# Patient Record
Sex: Male | Born: 1985
Health system: Southern US, Community
[De-identification: ages and names within clinical notes are randomized; demographics above are authoritative.]

## PROBLEM LIST (undated history)

## (undated) ENCOUNTER — Inpatient Hospital Stay: Admission: EM | Payer: Self-pay | Source: Home / Self Care

## (undated) DIAGNOSIS — E039 Hypothyroidism, unspecified: Secondary | ICD-10-CM

## (undated) DIAGNOSIS — N200 Calculus of kidney: Secondary | ICD-10-CM

## (undated) DIAGNOSIS — F209 Schizophrenia, unspecified: Secondary | ICD-10-CM

## (undated) HISTORY — PX: URETER SURGERY: SHX823

## (undated) HISTORY — PX: CYSTOSCOPY: SUR368

## (undated) HISTORY — DX: Schizophrenia, unspecified: F20.9

## (undated) HISTORY — DX: Hypothyroidism, unspecified: E03.9

---

## 2014-12-28 ENCOUNTER — Emergency Department (HOSPITAL_COMMUNITY)
Admission: EM | Admit: 2014-12-28 | Discharge: 2014-12-28 | Disposition: A | Discharge status: Home / Self Care | Payer: Medicaid Other | Attending: Emergency Medicine | Admitting: Emergency Medicine

## 2014-12-28 ENCOUNTER — Encounter (HOSPITAL_COMMUNITY): Payer: Self-pay | Admitting: Vascular Surgery

## 2014-12-28 DIAGNOSIS — R51 Headache: Secondary | ICD-10-CM | POA: Diagnosis not present

## 2014-12-28 DIAGNOSIS — H5713 Ocular pain, bilateral: Secondary | ICD-10-CM | POA: Insufficient documentation

## 2014-12-28 DIAGNOSIS — F17210 Nicotine dependence, cigarettes, uncomplicated: Secondary | ICD-10-CM | POA: Diagnosis not present

## 2014-12-28 DIAGNOSIS — H53149 Visual discomfort, unspecified: Secondary | ICD-10-CM | POA: Insufficient documentation

## 2014-12-28 DIAGNOSIS — Z87442 Personal history of urinary calculi: Secondary | ICD-10-CM | POA: Diagnosis not present

## 2014-12-28 HISTORY — DX: Calculus of kidney: N20.0

## 2014-12-28 LAB — COMPREHENSIVE METABOLIC PANEL
ALBUMIN: 4.2 g/dL (ref 3.5–5.0)
ALT: 15 U/L — AB (ref 17–63)
AST: 19 U/L (ref 15–41)
Alkaline Phosphatase: 39 U/L (ref 38–126)
Anion gap: 9 (ref 5–15)
BUN: 9 mg/dL (ref 6–20)
CHLORIDE: 103 mmol/L (ref 101–111)
CO2: 26 mmol/L (ref 22–32)
CREATININE: 0.83 mg/dL (ref 0.61–1.24)
Calcium: 9.6 mg/dL (ref 8.9–10.3)
GFR calc Af Amer: >60 mL/min (ref >60)
GFR calc non Af Amer: >60 mL/min (ref >60)
GLUCOSE: 92 mg/dL (ref 65–99)
Potassium: 4 mmol/L (ref 3.5–5.1)
SODIUM: 138 mmol/L (ref 135–145)
Total Bilirubin: 0.3 mg/dL (ref 0.3–1.2)
Total Protein: 7.6 g/dL (ref 6.5–8.1)

## 2014-12-28 LAB — CBC WITH DIFFERENTIAL/PLATELET
Basophils Absolute: 0 10*3/uL (ref 0.0–0.1)
Basophils Relative: 0 %
EOS PCT: 4 %
Eosinophils Absolute: 0.3 10*3/uL (ref 0.0–0.7)
HCT: 43.2 % (ref 39.0–52.0)
HEMOGLOBIN: 14 g/dL (ref 13.0–17.0)
LYMPHS ABS: 2.5 10*3/uL (ref 0.7–4.0)
LYMPHS PCT: 32 %
MCH: 28.3 pg (ref 26.0–34.0)
MCHC: 32.4 g/dL (ref 30.0–36.0)
MCV: 87.4 fL (ref 78.0–100.0)
MONOS PCT: 10 %
Monocytes Absolute: 0.7 10*3/uL (ref 0.1–1.0)
Neutro Abs: 4.2 10*3/uL (ref 1.7–7.7)
Neutrophils Relative %: 54 %
PLATELETS: 370 10*3/uL (ref 150–400)
RBC: 4.94 MIL/uL (ref 4.22–5.81)
RDW: 13.8 % (ref 11.5–15.5)
WBC: 7.8 10*3/uL (ref 4.0–10.5)

## 2014-12-28 MED ORDER — TETRACAINE HCL 0.5 % OP SOLN
2.0000 [drp] | Freq: Once | OPHTHALMIC | Status: AC
  Administered 2014-12-28: 2 [drp] via OPHTHALMIC
  Filled 2014-12-28: qty 2

## 2014-12-28 MED ORDER — FLUORESCEIN SODIUM 1 MG OP STRP
1.0000 | ORAL_STRIP | Freq: Once | OPHTHALMIC | Status: AC
  Administered 2014-12-28: 1 via OPHTHALMIC
  Filled 2014-12-28: qty 1

## 2014-12-28 MED ORDER — OXYCODONE-ACETAMINOPHEN 5-325 MG PO TABS
2.0000 | ORAL_TABLET | Freq: Once | ORAL | Status: AC
  Administered 2014-12-28: 2 via ORAL
  Filled 2014-12-28: qty 2

## 2014-12-28 NOTE — ED Notes (Signed)
Patient able to ambulate independently.  Pt was shown address to meet ophthalmologist across the street for emergency appointment.  Patient verbalized understanding.

## 2014-12-28 NOTE — ED Notes (Signed)
Pt reports to the ED for eval of severe HA and pain behind his right eye x 3 days. Pt denies any vision loss, blurred vision, or diplopia. Also denies any N/V. Pt reports light and sound make the pain worse. Pt A&Ox4, resp e/u, and skin warm and dry.

## 2014-12-28 NOTE — Discharge Instructions (Signed)
You have been seen today for eye pain. Your lab tests showed no abnormalities. Follow up with PCP as needed. Return to ED should symptoms worsen. Follow-up with the ophthalmologist as soon as you leave here.
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
## 2015-01-12 MED FILL — SUBOXONE 8 MG-2 MG SL FILM: 8-2 | 7 days supply | Qty: 14 | Fill #0

## 2015-01-19 MED FILL — SUBOXONE 8 MG-2 MG SL FILM: 8-2 | 7 days supply | Qty: 14 | Fill #0

## 2015-01-26 MED FILL — SUBOXONE 8 MG-2 MG SL FILM: 8-2 | 7 days supply | Qty: 14 | Fill #0

## 2015-02-02 MED FILL — SUBOXONE 8 MG-2 MG SL FILM: 8-2 | 7 days supply | Qty: 14 | Fill #0

## 2015-02-09 MED FILL — SUBOXONE 8 MG-2 MG SL FILM: 8-2 | 14 days supply | Qty: 28 | Fill #0

## 2015-02-22 MED FILL — SUBOXONE 8 MG-2 MG SL FILM: 8-2 | 14 days supply | Qty: 28 | Fill #0

## 2015-03-08 MED FILL — SUBOXONE 8 MG-2 MG SL FILM: 8-2 | 30 days supply | Qty: 60 | Fill #0

## 2015-04-05 MED FILL — SUBOXONE 8 MG-2 MG SL FILM: 8-2 | 28 days supply | Qty: 56 | Fill #0

## 2015-05-03 MED FILL — SUBOXONE 8 MG-2 MG SL FILM: 8-2 | 28 days supply | Qty: 56 | Fill #0

## 2015-05-31 MED FILL — SUBOXONE 8 MG-2 MG SL FILM: 8-2 | 28 days supply | Qty: 56 | Fill #0

## 2015-06-28 MED FILL — SUBOXONE 8 MG-2 MG SL FILM: 8-2 | 28 days supply | Qty: 56 | Fill #0

## 2015-07-26 MED FILL — SUBOXONE 8 MG-2 MG SL FILM: 8-2 | 28 days supply | Qty: 56 | Fill #0

## 2015-08-23 MED FILL — SUBOXONE 8 MG-2 MG SL FILM: 8-2 | 28 days supply | Qty: 56 | Fill #0

## 2015-09-18 MED FILL — SUBOXONE 8 MG-2 MG SL FILM: 8-2 | 28 days supply | Qty: 56 | Fill #0

## 2015-10-16 MED FILL — SUBOXONE 8 MG-2 MG SL FILM: 8-2 | 28 days supply | Qty: 56 | Fill #0

## 2015-10-25 ENCOUNTER — Encounter (HOSPITAL_COMMUNITY): Payer: Self-pay | Admitting: Emergency Medicine

## 2015-10-25 DIAGNOSIS — F333 Major depressive disorder, recurrent, severe with psychotic symptoms: Secondary | ICD-10-CM | POA: Diagnosis present

## 2015-10-25 LAB — COMPREHENSIVE METABOLIC PANEL
ALBUMIN: 4.2 g/dL (ref 3.5–5.0)
ALT: 31 U/L (ref 17–63)
ANION GAP: 4 — AB (ref 5–15)
AST: 25 U/L (ref 15–41)
Alkaline Phosphatase: 41 U/L (ref 38–126)
BILIRUBIN TOTAL: 0.5 mg/dL (ref 0.3–1.2)
BUN: 12 mg/dL (ref 6–20)
CALCIUM: 9.5 mg/dL (ref 8.9–10.3)
CO2: 29 mmol/L (ref 22–32)
Chloride: 105 mmol/L (ref 101–111)
Creatinine, Ser: 0.75 mg/dL (ref 0.61–1.24)
GFR calc non Af Amer: >60 mL/min (ref >60)
GLUCOSE: 93 mg/dL (ref 65–99)
POTASSIUM: 4.3 mmol/L (ref 3.5–5.1)
SODIUM: 138 mmol/L (ref 135–145)
TOTAL PROTEIN: 7.4 g/dL (ref 6.5–8.1)

## 2015-10-25 LAB — RAPID URINE DRUG SCREEN, HOSP PERFORMED
AMPHETAMINES: NOT DETECTED
BENZODIAZEPINES: NOT DETECTED
Barbiturates: NOT DETECTED
COCAINE: NOT DETECTED
OPIATES: NOT DETECTED
TETRAHYDROCANNABINOL: NOT DETECTED

## 2015-10-25 LAB — CBC
HCT: 38.6 % — ABNORMAL LOW (ref 39.0–52.0)
HEMOGLOBIN: 12.9 g/dL — AB (ref 13.0–17.0)
MCH: 28.2 pg (ref 26.0–34.0)
MCHC: 33.4 g/dL (ref 30.0–36.0)
MCV: 84.5 fL (ref 78.0–100.0)
PLATELETS: 349 10*3/uL (ref 150–400)
RBC: 4.57 MIL/uL (ref 4.22–5.81)
RDW: 13.1 % (ref 11.5–15.5)
WBC: 7.7 10*3/uL (ref 4.0–10.5)

## 2015-10-25 LAB — ETHANOL: Alcohol, Ethyl (B): <5 mg/dL (ref <5)

## 2015-10-25 MED ORDER — OLANZAPINE 5 MG PO TBDP
5.0000 mg | ORAL_TABLET | Freq: Every day | ORAL | Status: DC
  Filled 2015-10-25 (×3): qty 1

## 2015-10-25 MED ORDER — OLANZAPINE 5 MG PO TABS: 5.0000 mg | ORAL_TABLET | Freq: Every day | ORAL | Status: DC
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
## 2015-10-25 NOTE — ED Notes (Signed)
Pt given food and Coke to drink. Explained delay
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
## 2015-10-26 ENCOUNTER — Inpatient Hospital Stay (HOSPITAL_COMMUNITY)
Admission: AD | Admit: 2015-10-26 | Discharge: 2015-10-29 | DRG: 885 | Disposition: A | Discharge status: Home / Self Care | Payer: Medicaid Other | Attending: Psychiatry | Admitting: Psychiatry

## 2015-10-26 ENCOUNTER — Encounter (HOSPITAL_COMMUNITY): Payer: Self-pay | Admitting: *Deleted

## 2015-10-26 ENCOUNTER — Emergency Department (HOSPITAL_COMMUNITY)
Admission: EM | Admit: 2015-10-26 | Discharge: 2015-10-26 | Disposition: A | Discharge status: Other Acute Inpatient Hospital | Payer: Medicaid Other

## 2015-10-26 DIAGNOSIS — F4310 Post-traumatic stress disorder, unspecified: Secondary | ICD-10-CM | POA: Diagnosis not present

## 2015-10-26 DIAGNOSIS — F333 Major depressive disorder, recurrent, severe with psychotic symptoms: Principal | ICD-10-CM | POA: Diagnosis present

## 2015-10-26 DIAGNOSIS — F5101 Primary insomnia: Secondary | ICD-10-CM | POA: Diagnosis not present

## 2015-10-26 DIAGNOSIS — G4700 Insomnia, unspecified: Secondary | ICD-10-CM | POA: Diagnosis present

## 2015-10-26 DIAGNOSIS — Z79899 Other long term (current) drug therapy: Secondary | ICD-10-CM | POA: Diagnosis not present

## 2015-10-26 DIAGNOSIS — F22 Delusional disorders: Secondary | ICD-10-CM | POA: Diagnosis not present

## 2015-10-26 DIAGNOSIS — F419 Anxiety disorder, unspecified: Secondary | ICD-10-CM | POA: Diagnosis present

## 2015-10-26 DIAGNOSIS — F17210 Nicotine dependence, cigarettes, uncomplicated: Secondary | ICD-10-CM | POA: Diagnosis present

## 2015-10-26 LAB — COMPREHENSIVE METABOLIC PANEL
ALK PHOS: 41 U/L (ref 38–126)
ALT: 29 U/L (ref 17–63)
AST: 23 U/L (ref 15–41)
Albumin: 4.1 g/dL (ref 3.5–5.0)
Anion gap: 7 (ref 5–15)
BILIRUBIN TOTAL: 0.7 mg/dL (ref 0.3–1.2)
BUN: 13 mg/dL (ref 6–20)
CO2: 27 mmol/L (ref 22–32)
CREATININE: 0.69 mg/dL (ref 0.61–1.24)
Calcium: 9.4 mg/dL (ref 8.9–10.3)
Chloride: 106 mmol/L (ref 101–111)
GFR calc Af Amer: >60 mL/min (ref >60)
GLUCOSE: 84 mg/dL (ref 65–99)
Potassium: 4.3 mmol/L (ref 3.5–5.1)
Sodium: 140 mmol/L (ref 135–145)
TOTAL PROTEIN: 7.4 g/dL (ref 6.5–8.1)

## 2015-10-26 LAB — CBC
HEMATOCRIT: 42.8 % (ref 39.0–52.0)
Hemoglobin: 14.3 g/dL (ref 13.0–17.0)
MCH: 28.7 pg (ref 26.0–34.0)
MCHC: 33.4 g/dL (ref 30.0–36.0)
MCV: 85.9 fL (ref 78.0–100.0)
PLATELETS: 313 10*3/uL (ref 150–400)
RBC: 4.98 MIL/uL (ref 4.22–5.81)
RDW: 13.4 % (ref 11.5–15.5)
WBC: 8.3 10*3/uL (ref 4.0–10.5)

## 2015-10-26 LAB — TSH: TSH: 4.946 u[IU]/mL — ABNORMAL HIGH (ref 0.350–4.500)

## 2015-10-26 LAB — LIPID PANEL
CHOLESTEROL: 148 mg/dL (ref 0–200)
HDL: 46 mg/dL (ref >40)
LDL CALC: 88 mg/dL (ref 0–99)
TRIGLYCERIDES: 69 mg/dL (ref <150)
Total CHOL/HDL Ratio: 3.2 RATIO
VLDL: 14 mg/dL (ref 0–40)

## 2015-10-26 MED ORDER — HYDROXYZINE HCL 25 MG PO TABS
25.0000 mg | ORAL_TABLET | Freq: Four times a day (QID) | ORAL | Status: DC | PRN
Start: 2015-10-26 — End: 2015-10-29
  Filled 2015-10-26: qty 1

## 2015-10-26 MED ORDER — TRAZODONE HCL 50 MG PO TABS: 50.0000 mg | ORAL_TABLET | Freq: Every evening | ORAL | Status: DC | PRN

## 2015-10-26 MED ORDER — LEVOTHYROXINE SODIUM 25 MCG PO TABS
25.0000 ug | ORAL_TABLET | Freq: Every day | ORAL | Status: DC
  Administered 2015-10-26 – 2015-10-29 (×4): 25 ug via ORAL
  Filled 2015-10-26 (×6): qty 1

## 2015-10-26 MED ORDER — RISPERIDONE 1 MG PO TABS
1.0000 mg | ORAL_TABLET | Freq: Two times a day (BID) | ORAL | Status: DC
  Administered 2015-10-26 – 2015-10-29 (×6): 1 mg via ORAL
  Filled 2015-10-26 (×11): qty 1

## 2015-10-26 MED ORDER — ALUM & MAG HYDROXIDE-SIMETH 200-200-20 MG/5ML PO SUSP
30.0000 mL | ORAL | Status: DC | PRN
  Administered 2015-10-26: 30 mL via ORAL
  Filled 2015-10-26: qty 30

## 2015-10-26 MED ORDER — MAGNESIUM HYDROXIDE 400 MG/5ML PO SUSP: 30.0000 mL | Freq: Every day | ORAL | Status: DC | PRN

## 2015-10-26 MED ORDER — ACETAMINOPHEN 325 MG PO TABS: 650.0000 mg | ORAL_TABLET | Freq: Four times a day (QID) | ORAL | Status: DC | PRN

## 2015-10-26 MED ORDER — NICOTINE POLACRILEX 2 MG MT GUM: 2.0000 mg | CHEWING_GUM | OROMUCOSAL | Status: DC | PRN

## 2015-10-26 NOTE — Progress Notes (Signed)
Patient ID: Anthony Ortiz, male   DOB: Jul 28, 1985, 28 y.o.   MRN: [id number]  D: Patient observed watching TV and interacting well with peers. Pt reports he is well and want to go home. Pt reports he had a good conversation with wife and she is willing to talk to providers about lying to get him admitted. Pt stated this is the second time she has lied to get him admitted in a hospital. Pt reports he had time to rethink about his behavior towards wife. Denies  SI/HI/AVH and pain.No behavioral issues noted.  A: Support and encouragement offered as needed.  R: Patient is safe and cooperative on unit. Will continue to monitor patient for safety and stability.

## 2015-10-26 NOTE — ED Notes (Signed)
[facility] for paranoia. [facility] and was [facility], Sp[facility] to counselor [facility] who has been trying to locate patient since he was transported. Sheriffs dept [facility]route [facility]
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
## 2015-10-26 NOTE — [facility] Suicide Risk Assessment (Signed)
[facility] INPATIENT:  Family/Significant Other Suicide Prevention Education  Suicide Prevention Education:  Education Completed;No one has been identified by the patient as the family member/significant other with whom the patient will be residing, and identified as the person(s) who will aid the patient in the event of a mental health crisis (suicidal ideations/suicide attempt).  With written consent from the patient, the family member/significant other has been provided the following suicide prevention education, prior to the and/or following the discharge of the patient.  The suicide prevention education provided includes the following:  Suicide risk factors  Suicide prevention and interventions  National Suicide Hotline telephone number  [facility] assessment telephone number  [facility] Emergency Assistance 911  [facility] and/or Residential Mobile Crisis Unit telephone number  Request made of family/significant other to:  Remove weapons (e.g., guns, rifles, knives), all items previously/currently identified as safety concern.    Remove drugs/medications (over-the-counter, prescriptions, illicit drugs), all items previously/currently identified as a safety concern.  The family member/significant other verbalizes understanding of the suicide prevention education information provided.  The family member/significant other agrees to remove the items of safety concern listed above. Patient did not endorse SI at admission or during his stay here. SPE not required   Anthony DaubJolan Siddhant Ortiz 10/26/2015, 10:36 AM

## 2015-10-26 NOTE — Tx Team (Signed)
Initial Treatment Plan 10/26/2015 1:57 AM Anthony Ortiz Maners [id number]    PATIENT STRESSORS: Marital or family conflict Medication change or noncompliance   PATIENT STRENGTHS: Ability for insight Average or above average intelligence Capable of independent living General fund of knowledge   PATIENT IDENTIFIED PROBLEMS: Anxiety Paranoia "Only problem I have is feeling super paranoid"                     DISCHARGE CRITERIA:  Ability to meet basic life and health needs Improved stabilization in mood, thinking, and/or behavior Verbal commitment to aftercare and medication compliance  PRELIMINARY DISCHARGE PLAN: Attend aftercare/continuing care group Return to previous living arrangement  PATIENT/FAMILY INVOLVEMENT: This treatment plan has been presented to and reviewed with the patient, Anthony Ortiz Trimpe, and/or family member, .  The patient and family have been given the opportunity to ask questions and make suggestions.  Donette Mainwaring, [place], [place] 10/26/2015, 1:57 AM

## 2015-10-26 NOTE — Plan of Care (Signed)
Problem: Safety: Goal: Periods of time without injury will increase Outcome: Progressing Pt is safe, denies SI/HI

## 2015-10-26 NOTE — [facility] Suicide Risk Assessment (Signed)
[facility]   Nursing information obtained from:   pt Demographic factors:   male caucasian Current Mental Status:   see MSE Loss Factors:   recent incarceration Historical Factors:   substance use Risk Reduction Factors:   n/a  Total Time spent with patient: 15 minutes Principal Problem: MDD (major depressive disorder), recurrent, severe, with psychosis (HCC) Diagnosis:   Patient Active Problem List   Diagnosis Date Noted  . MDD (major depressive disorder), recurrent, severe, with psychosis (HCC) [F33.3] 10/25/2015   Subjective Data: Patient denies any current suicidal or homicidal ideation, plan or intent. He does however describe an elaborate delusional system that involves people breaking into his house to plant papers and which his wife might be a part of. He has no insight into this and believes it is true.  Continued Clinical Symptoms:  Alcohol Use Disorder Identification Test Final Score (AUDIT): 2 The "Alcohol Use Disorders Identification Test", Guidelines for Use in Primary Care, Second Edition.  World Science writerHealth Organization [facility](WHO). Score between 0-7:  no or low risk or alcohol related problems. Score between 8-15:  moderate risk of alcohol related problems. Score between 16-19:  high risk of alcohol related problems. Score 20 or above:  warrants further diagnostic evaluation for alcohol dependence and treatment.   CLINICAL FACTORS:   Alcohol/Substance Abuse/Dependencies Schizophrenia:   Paranoid or undifferentiated type   Musculoskeletal: Strength & Muscle Tone: within normal limits Gait & Station: normal Patient leans: N/A  Psychiatric Specialty Exam: Physical Exam  ROS  Blood pressure (!) 99/40, pulse (!) 48, temperature 98.1 F (36.7 C), temperature source Oral, resp. rate 16, height 5\' 10"  (1.778 m), weight 65.8 kg (145 lb).Body mass index is 20.81 kg/m.  General Appearance: Casual  Eye Contact:  Good  Speech:  Clear and Coherent   Volume:  Normal  Mood:  Anxious  Affect:  Appropriate and Congruent  Thought Process:  Goal Directed  Orientation:  Full (Time, Place, and Person)  Thought Content:  Delusions, Obsessions, Paranoid Ideation and Rumination  Suicidal Thoughts:  No  Homicidal Thoughts:  No  Memory:  Negative  Judgement:  Poor  Insight:  Lacking  Psychomotor Activity:  Normal  Concentration:  Concentration: Good and Attention Span: Good  Recall:  Good  Fund of Knowledge:  Good  Language:  Good  Akathisia:  No  Handed:  Right  AIMS (if indicated):     Assets:  Physical Health  ADL's:  Intact  Cognition:  WNL  Sleep:  Number of Hours: 3      COGNITIVE FEATURES THAT CONTRIBUTE TO RISK:  Closed-mindedness    SUICIDE RISK:   Moderate: Patient with acute psychosis. At present he denies plans to harm self or others but due to the fact that he has actively psychotic and has no insight into the delusional nature of his thinking he would be at risk to react to this over time.   PLAN OF CARE: see PAA  I certify that inpatient services furnished can reasonably be expected to improve the patient's condition.  Acquanetta SitElizabeth Woods Oates, MD 10/26/2015, 11:10 AM

## 2015-10-26 NOTE — ED Notes (Signed)
[facility]. [place] [employer] [employer] [facility] at [facility]. All belongings sent with officer including clothes and wallet

## 2015-10-26 NOTE — Progress Notes (Signed)
30 year old male patient admitted on involuntary basis. Anthony Ortiz presents as paranoid and irritable. Anthony Ortiz spoke about how his wife had him committed. Anthony Ortiz spoke about how he feels his wife is cheating on him, spoke about how he knows that people are coming into his house and putting things into his house. He says he has cameras set up around his house and has seen people at his back door and is convinced these events are happening. Anthony Ortiz also believes that someone is trying to kill him and repeatedly asked if he was going to be stabbed while he was here. Anthony Ortiz reports that he is not on any medications other than suboxone, he reports that [facility] and also spoke about how he got suboxone while he was in prison. He spoke about how he has been on this for the past 6 years because prior to this he was using heroin. Anthony Ortiz denies any drug use and spoke about how he only drinks a beer every now and then. Anthony Ortiz denied any SI and is able to contract for safety while in the hospital. Hospital rules were explained to Anthony Ortiz and he expressed his understanding of the rules. He was oriented to the unit and safety maintained.
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
## 2015-10-26 NOTE — Progress Notes (Signed)
DAR NOTE: Patient presents with anxious affect and depressed mood.  Denies pain, auditory and visual hallucinations.  Rates depression at 0, hopelessness at 0, and anxiety at 4.  Described energy level as normal and concentration as good.  Maintained on routine safety checks.  Medications given as prescribed.  Support and encouragement offered as needed.  Attended group and participated.  States goal for today is "to calm down my paranoia and anxiety."  Patient was visible in milieu but was preoccupied with getting discharged.

## 2015-10-26 NOTE — [facility] Group Notes (Signed)
[facility] LCSW Group Therapy  10/26/2015  1:05 PM  Type of Therapy:  Group therapy  Participation Level:  Active  Participation Quality:  Attentive  Affect:  Flat  Cognitive:  Oriented  Insight:  Limited  Engagement in Therapy:  Limited  Modes of Intervention:  Discussion, Socialization  Summary of Progress/Problems:  Chaplain was here to lead a group on themes of hope and courage. Stayed the entire time, engaged throughout. "I need courage for strength."  Vague.  Was not able to say what the strength is need for, but later did say he needs courage "to try to figure out what is going on here."  Anthony Ortiz, Anthony Ortiz B 10/26/2015 2:27 PM

## 2015-10-26 NOTE — Progress Notes (Signed)
Recreation Therapy Notes  Date: 10/26/15 Time: 1000 Location: 500 Hall Dayroom  Group Topic: Leisure Education  Goal Area(s) Addresses:  Patient will identify positive leisure activities.  Patient will identify one positive benefit of participation in leisure activities.   Intervention: 20 plastic cups, 2 soft foam balls  Activity: Bowling.  Patients were divided into two teams.  Each patient got two chances to knock down all the "pins".  Patients received a point for each "pin" they knocked.  The first team to reach 100 won.    Education:  Leisure Education, Building control surveyorDischarge Planning  Education Outcome: Acknowledges education/In group clarification offered/Needs additional education  Clinical Observations/Feedback: Pt did not attend group.   Caroll RancherMarjette Chamia Schmutz, LRT/CTRS     Lillia AbedLindsay, Ezzie Senat A 10/26/2015 1:15 PM

## 2015-10-26 NOTE — Progress Notes (Signed)
[facility] Group Notes:  (Nursing/MHT/Case Management/Adjunct)  Date:  10/26/2015  Time:  9:54 PM  Type of Therapy:  Psychoeducational Skills  Participation Level:  Active  Participation Quality:  Appropriate  Affect:  Appropriate  Cognitive:  Appropriate  Insight:  Improving  Engagement in Group:  Improving  Modes of Intervention:  Education  Summary of Progress/Problems: The patient indicated that he had a good day overall and that he had a good visit with his girlfriend. As a theme for the day, his relapse prevention will involve smoking less.   Anthony Ortiz, Anthony Ortiz 10/26/2015, 9:54 PM

## 2015-10-27 DIAGNOSIS — G4700 Insomnia, unspecified: Secondary | ICD-10-CM | POA: Diagnosis present

## 2015-10-27 DIAGNOSIS — F4310 Post-traumatic stress disorder, unspecified: Secondary | ICD-10-CM | POA: Diagnosis present

## 2015-10-27 DIAGNOSIS — F5101 Primary insomnia: Secondary | ICD-10-CM

## 2015-10-27 DIAGNOSIS — Z79899 Other long term (current) drug therapy: Secondary | ICD-10-CM

## 2015-10-27 DIAGNOSIS — F22 Delusional disorders: Secondary | ICD-10-CM | POA: Diagnosis present

## 2015-10-27 LAB — HEMOGLOBIN A1C
HEMOGLOBIN A1C: 5.2 % (ref 4.8–5.6)
Mean Plasma Glucose: 103 mg/dL

## 2015-10-27 MED ORDER — BLISTEX MEDICATED EX OINT
TOPICAL_OINTMENT | CUTANEOUS | Status: DC | PRN
  Filled 2015-10-27: qty 6.3

## 2015-10-27 NOTE — [facility] Group Notes (Signed)
[facility] Group Notes: (Clinical Social Work)   10/27/2015      Type of Therapy:  Group Therapy   Participation Level:  Did Not Attend despite MHT prompting   Bethene Hankinson Grossman-Orr, LCSW 10/27/2015, 1:10 PM     
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
## 2015-10-27 NOTE — Progress Notes (Signed)
[facility] MD Progress Note  10/27/2015 7:00 PM Anthony Ortiz  MRN:  [id number] Subjective:  "I would like to go home today if I can."  Objective: Pt seen and chart reviewed. Pt is alert/oriented x4, calm, cooperative, and appropriate to situation. Pt denies suicidal/homicidal ideation and psychosis and does not appear to be responding to internal stimuli. Pt reports that he feels he should not be here but is understanding that he has an IVC and that we have to observe him in order to be sure we can rescind this. However, collateral from wife supports everything the pt has said. Today, he is much more calm and appears less agitated.   Principal Problem: MDD (major depressive disorder), recurrent, severe, with psychosis ([place]) Diagnosis:   Patient Active Problem List   Diagnosis Date Noted  . MDD (major depressive disorder), recurrent, severe, with psychosis ([place]) [F33.3] 10/25/2015   Total Time spent with patient: 25 minutes  Past Psychiatric History: See H&P  Past Medical History:  Past Medical History:  Diagnosis Date  . Kidney stones     Past Surgical History:  Procedure Laterality Date  . URETER SURGERY     Family History: History reviewed. No pertinent family history. Family Psychiatric  History: See H&P Social History:  History  Alcohol Use No     History  Drug Use No    Social History   Social History  . Marital status: Legally Separated    Spouse name: N/A  . Number of children: N/A  . Years of education: N/A   Social History Main Topics  . Smoking status: Current Every Day Smoker    Packs/day: 1.00    Types: Cigarettes  . Smokeless tobacco: Never Used  . Alcohol use No  . Drug use: No  . Sexual activity: Not Asked   Other Topics Concern  . None   Social History Narrative  . None   Additional Social History:    Pain Medications: Pt denies Prescriptions: Ambien, Ativan Over the Counter: Pt denies History of alcohol / drug use?: Yes Longest period of  sobriety (when/how long): unknown Name of Substance 1: Opiates 1 - Age of First Use: unknown 1 - Amount (size/oz): unknown 1 - Frequency: unknown 1 - Duration: unknown 1 - Last Use / Amount: unknown                  Sleep: Fair  Appetite:  Fair  Current Medications: Current Facility-Administered Medications  Medication Dose Route Frequency Provider Last Rate Last Dose  . acetaminophen (TYLENOL) tablet 650 mg  650 mg Oral Q6H PRN Ethelene Hal, NP      . alum & mag hydroxide-simeth (MAALOX/MYLANTA) 200-200-20 MG/5ML suspension 30 mL  30 mL Oral Q4H PRN Ethelene Hal, NP   30 mL at 10/26/15 1825  . hydrOXYzine (ATARAX/VISTARIL) tablet 25 mg  25 mg Oral Q6H PRN Ethelene Hal, NP      . levothyroxine (SYNTHROID, LEVOTHROID) tablet 25 mcg  25 mcg Oral QAC breakfast Benjamine Mola, FNP   25 mcg at 10/27/15 [phone]  . magnesium hydroxide (MILK OF MAGNESIA) suspension 30 mL  30 mL Oral Daily PRN Ethelene Hal, NP      . nicotine polacrilex (NICORETTE) gum 2 mg  2 mg Oral PRN Ursula Alert, MD      . risperiDONE (RISPERDAL) tablet 1 mg  1 mg Oral BID Benjamine Mola, FNP   1 mg at 10/27/15 1715  . traZODone (DESYREL)  tablet 50 mg  50 mg Oral QHS PRN,MR X 1 Benjamine Mola, FNP        Lab Results:  Results for orders placed or performed during the hospital encounter of 10/26/15 (from the past 48 hour(s))  CBC     Status: None   Collection Time: 10/26/15  6:40 AM  Result Value Ref Range   WBC 8.3 4.0 - 10.5 K/uL   RBC 4.98 4.22 - 5.81 MIL/uL   Hemoglobin 14.3 13.0 - 17.0 g/dL   HCT 42.8 39.0 - 52.0 %   MCV 85.9 78.0 - 100.0 fL   MCH 28.7 26.0 - 34.0 pg   MCHC 33.4 30.0 - 36.0 g/dL   RDW 13.4 11.5 - 15.5 %   Platelets 313 150 - 400 K/uL    Comment: Performed at [facility]  Hemoglobin A1c     Status: None   Collection Time: 10/26/15  6:40 AM  Result Value Ref Range   Hgb A1c MFr Bld 5.2 4.8 - 5.6 %    Comment: (NOTE)          Pre-diabetes: 5.7 - 6.4         Diabetes: >6.4         Glycemic control for adults with diabetes: <7.0    Mean Plasma Glucose 103 mg/dL    Comment: (NOTE) Performed At: [facility] [place], [place] [id number] Lindon Romp MD IP:[id number] Performed at [facility]   Lipid panel     Status: None   Collection Time: 10/26/15  6:40 AM  Result Value Ref Range   Cholesterol 148 0 - 200 mg/dL   Triglycerides 69 <150 mg/dL   HDL 46 >40 mg/dL   Total CHOL/HDL Ratio 3.2 RATIO   VLDL 14 0 - 40 mg/dL   LDL Cholesterol 88 0 - 99 mg/dL    Comment:        Total Cholesterol/HDL:CHD Risk Coronary Heart Disease Risk Table                     Men   Women  1/2 Average Risk   3.4   3.3  Average Risk       5.0   4.4  2 X Average Risk   9.6   7.1  3 X Average Risk  23.4   11.0        Use the calculated Patient Ratio above and the CHD Risk Table to determine the patient's CHD Risk.        ATP III CLASSIFICATION (LDL):  <100     mg/dL   Optimal  100-129  mg/dL   Near or Above                    Optimal  130-159  mg/dL   Borderline  160-189  mg/dL   High  >190     mg/dL   Very High Performed at [facility]   TSH     Status: Abnormal   Collection Time: 10/26/15  6:40 AM  Result Value Ref Range   TSH 4.946 (H) 0.350 - 4.500 uIU/mL    Comment: Performed by a 3rd Generation assay with a functional sensitivity of <=0.01 uIU/mL. Performed at [facility]   Comprehensive metabolic panel     Status: None   Collection Time: 10/26/15  6:40 AM  Result Value Ref Range   Sodium 140 135 - 145 mmol/L  Potassium 4.3 3.5 - 5.1 mmol/L   Chloride 106 101 - 111 mmol/L   CO2 27 22 - 32 mmol/L   Glucose, Bld 84 65 - 99 mg/dL   BUN 13 6 - 20 mg/dL   Creatinine, Ser 0.69 0.61 - 1.24 mg/dL   Calcium 9.4 8.9 - 10.3 mg/dL   Total Protein 7.4 6.5 - 8.1 g/dL   Albumin 4.1 3.5 - 5.0 g/dL   AST 23 15 - 41 U/L   ALT 29 17 - 63 U/L    Alkaline Phosphatase 41 38 - 126 U/L   Total Bilirubin 0.7 0.3 - 1.2 mg/dL   GFR calc non Af Amer >60 >60 mL/min   GFR calc Af Amer >60 >60 mL/min    Comment: (NOTE) The eGFR has been calculated using the CKD EPI equation. This calculation has not been validated in all clinical situations. eGFR's persistently <60 mL/min signify possible Chronic Kidney Disease.    Anion gap 7 5 - 15    Comment: Performed at [facility]    Blood Alcohol level:  Lab Results  Component Value Date   [facility] <5 22/29/7989    Metabolic Disorder Labs: Lab Results  Component Value Date   HGBA1C 5.2 10/26/2015   MPG 103 10/26/2015   No results found for: PROLACTIN Lab Results  Component Value Date   CHOL 148 10/26/2015   TRIG 69 10/26/2015   HDL 46 10/26/2015   CHOLHDL 3.2 10/26/2015   VLDL 14 10/26/2015   LDLCALC 88 10/26/2015    Physical Findings: AIMS: Facial and Oral Movements Muscles of Facial Expression: None, normal Lips and Perioral Area: None, normal Jaw: None, normal Tongue: None, normal,Extremity Movements Upper (arms, wrists, hands, fingers): None, normal Lower (legs, knees, ankles, toes): None, normal, Trunk Movements Neck, shoulders, hips: None, normal, Overall Severity Severity of abnormal movements (highest score from questions above): None, normal Incapacitation due to abnormal movements: None, normal Patient's awareness of abnormal movements (rate only patient's report): No Awareness, Dental Status Current problems with teeth and/or dentures?: No Does patient usually wear dentures?: No  CIWA:    COWS:     Musculoskeletal: Strength & Muscle Tone: within normal limits Gait & Station: normal Patient leans: N/A  Psychiatric Specialty Exam: Physical Exam  Review of Systems  Psychiatric/Behavioral: Negative for depression, hallucinations, substance abuse and suicidal ideas. The patient is nervous/anxious.   All other systems reviewed and are negative.    Blood pressure (!) 100/57, pulse 71, temperature 98.6 F (37 C), temperature source Oral, resp. rate 16, height 5' 10"  (1.778 m), weight 65.8 kg (145 lb).Body mass index is 20.81 kg/m.  General Appearance: Casual and Fairly Groomed  Eye Contact:  Good  Speech:  Clear and Coherent and Normal Rate  Volume:  Normal  Mood:  Anxious  Affect:  Appropriate and Congruent  Thought Process:  Coherent, Goal Directed, Linear and Descriptions of Associations: Intact  Orientation:  Full (Time, Place, and Person)  Thought Content:  Discharge plans  Suicidal Thoughts:  No  Homicidal Thoughts:  No  Memory:  Immediate;   Fair Recent;   Fair Remote;   Fair  Judgement:  Fair  Insight:  Fair  Psychomotor Activity:  Normal  Concentration:  Concentration: Fair and Attention Span: Fair  Recall:  [employer] of Knowledge:  Fair  Language:  Fair  Akathisia:  No  Handed:    AIMS (if indicated):     Assets:  Communication Skills Desire for Improvement Resilience  Social Support Talents/Skills  ADL's:  Intact  Cognition:  WNL  Sleep:  Number of Hours: 6   Treatment Plan Summary: 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, unstable yet not psychotic, managed as below:  *I have reviewed plan today on 10/27/15 and concur that it is effective, will continue to let serum levels build for medications and will consider titration on a daily basis; continue plan as below:   Medications:  -Continue Vistaril 78m po q6h prn anxiety -Continue Risperidone 148mpo bid for rumination -Continue Trazodone 5054mo qhs prn insomnia -Start Synthroid 39m37mam for hypothyroidism  Labs/tests:  -Baseline EKG -Reviewed  CBC, CMP, UDS, and all WNL except TSH 4.9 (hypothyroid) -Order prolactin, A1C, lipid panel for baseline with risperidone  WithBenjamine MolaP 10/27/2015, 7:00 PM  I agree with findings and treatment plan of this patient

## 2015-10-27 NOTE — Progress Notes (Signed)
DAR NOTE: Patient presents with anxious affect and depressed mood.  Denies pain, auditory and visual hallucinations.  Rates depression at 0, hopelessness at 0, and anxiety at 1.  Described energy level as normal and concentration as good.  Maintained on routine safety checks.  Medications given as prescribed.  Support and encouragement offered as needed.  Attended group and participated.  States goal for today is "discharge and my relationship with my wife ."  Patient observed socializing with peers in the dayroom.  Patient complain of sore lip.  RNP Renata CapriceConrad made aware.

## 2015-10-27 NOTE — Progress Notes (Signed)
Did not attend group 

## 2015-10-27 NOTE — Progress Notes (Signed)
Adult Psychoeducational Group Note  Date:  10/27/2015 Time:  10:37 AM  Group Topic/Focus:  Making Healthy Choices:   The focus of this group is to help patients identify negative/unhealthy choices they were using prior to admission and identify positive/healthier coping strategies to replace them upon discharge.   Participation Level:  Did Not Attend Anthony Ortiz 10/27/2015, 10:37 AM

## 2015-10-28 MED ORDER — LIP MEDEX EX OINT
TOPICAL_OINTMENT | CUTANEOUS | Status: DC | PRN
  Administered 2015-10-28 – 2015-10-29 (×3): via TOPICAL
  Filled 2015-10-28: qty 7

## 2015-10-28 MED ORDER — VALACYCLOVIR HCL 500 MG PO TABS
1000.0000 mg | ORAL_TABLET | Freq: Two times a day (BID) | ORAL | Status: DC
  Administered 2015-10-28 – 2015-10-29 (×3): 1000 mg via ORAL
  Filled 2015-10-28 (×7): qty 2

## 2015-10-28 NOTE — Plan of Care (Signed)
Problem: Coping: Goal: Ability to identify and develop effective coping behavior will improve Outcome: Progressing Pt continues to present paranoid, pt seen pacing the hallway earlier

## 2015-10-28 NOTE — [facility] Group Notes (Signed)
[facility] Group Notes:  (Clinical Social Work)  10/28/2015  11:00AM-12:00PM  Summary of Progress/Problems:  The main focus of today's process group was to listen to a variety of genres of music and to identify that different types of music provoke different responses.  The patient then was able to identify personally what was soothing for them, as well as energizing, as well as how patient can personally use this knowledge in sleep habits, with depression, and with other symptoms.  The patient expressed at the beginning of group the overall feeling of "stable."  He was present for most of group, but did leave prior to the end and thus could not be asked how he felt at the end.  He was very calm and alert throughout group, however.  Type of Therapy:  Music Therapy   Participation Level:  Active  Participation Quality:  Attentive and Sharing  Affect:  Blunted  Cognitive:  Oriented  Insight:  Engaged  Engagement in Therapy:  Engaged  Modes of Intervention:   Activity, Exploration  Anthony MantleMareida Grossman-Orr, LCSW 10/28/2015

## 2015-10-28 NOTE — Progress Notes (Signed)
D: A & O X4. Denies SI, HI, AVH and pain when assessed. Presents anxious, worried about cold sore on his lip. Reported a good night sleep with good appetite, normal energy and good concentration level. Rated his depression 0/10, hopelessness 0/10 and anxiety 1/10 on self inventory sheet. Pt's goal for today "control my anger, work on my relationship with my wife and family". A: Medications administered as prescribed with verbal education. Support and availability provided to pt. Writer encouraged pt to attend groups and voice concerns. Conrad NP made aware of pt's cold sore and new order received for blistex lip balm which was offered to pt. Q 15 minutes safety checks maintained without outburst thus far or self harm gestures to note.  R: Pt has been medication compliant. Denies adverse drug reactions at this time. Attended unit groups.Tolerates all PO intake well. Remains safe on and off unit without physical distress to note at this time. POC continues without issues.

## 2015-10-28 NOTE — Progress Notes (Signed)
Did not attend groups 

## 2015-10-28 NOTE — [facility] Group Notes (Signed)
[facility] Group Notes:  (Nursing/MHT/Case Management/Adjunct)  Date:  10/28/2015  Time:  7:37 PM   Type of Therapy:  Psychoeducational Skills  Participation Level:  Active  Participation Quality:  Appropriate  Affect:  Appropriate  Cognitive:  Appropriate  Insight:  Appropriate  Engagement in Group:  Engaged  Modes of Intervention:  Problem-solving  Summary of Progress/Problems: Group encouraged to surround themselves with positive and healthy group/support system when changing to a healthy life style.    Anthony PunchesJane O Zubin Ortiz 10/28/2015, 7:37 PM

## 2015-10-28 NOTE — Progress Notes (Signed)
[facility]  10/28/2015 5:00 PM Anthony Ortiz  MRN:  [id number] Subjective:  "I'm hoping my family can come pick me up tomorrow. I slept great and I feel much better. I'm not worrying about things as much."   Objective: Pt seen and chart reviewed. Pt is alert/oriented x4, calm, cooperative, and appropriate to situation. Pt denies suicidal/homicidal ideation and psychosis and does not appear to be responding to internal stimuli. Pt presents as much more calm today and reports that he is not angry or anxious. Pt was extremely anxious a few days ago and he has made remarkable progress.  Please discharge in AM on Monday as IVC was inappropriate as he had spent 7 days at [place] prior to coming here and wife admitted she lied, leading to IVC. Pt is not suicidal/homicidal, or psychotic and was not at the time of arrival. Given his current progress, he could easily manage these symptoms outpatient with counseling and mild med management. Dr. Gilmore LarocheAkhtar concurs with Monday discharge if pt continues to present stable as he is today.   Principal Problem: PTSD (post-traumatic stress disorder) Diagnosis:   Patient Active Problem List   Diagnosis Date Noted  . PTSD (post-traumatic stress disorder) [F43.10] 10/27/2015    Priority: High  . Paranoid ideation ([facility]) [F22] 10/27/2015    Priority: High  . Insomnia [G47.00] 10/27/2015    Priority: High   Total Time spent with patient: 25 minutes  Past Psychiatric History: See H&P  Past Medical History:  Past Medical History:  Diagnosis Date  . Kidney stones     Past Surgical History:  Procedure Laterality Date  . URETER SURGERY     Family History: History reviewed. No pertinent family history. Family Psychiatric  History: See H&P Social History:  History  Alcohol Use No     History  Drug Use No    Social History   Social History  . Marital status: Legally Separated    Spouse name: N/A  . Number of children: N/A  . Years of  education: N/A   Social History Main Topics  . Smoking status: Current Every Day Smoker    Packs/day: 1.00    Types: Cigarettes  . Smokeless tobacco: Never Used  . Alcohol use No  . Drug use: No  . Sexual activity: Not Asked   Other Topics Concern  . None   Social History Narrative  . None   Additional Social History:    Pain Medications: Pt denies Prescriptions: Ambien, Ativan Over the Counter: Pt denies History of alcohol / drug use?: Yes Longest period of sobriety (when/how long): unknown Name of Substance 1: Opiates 1 - Age of First Use: unknown 1 - Amount (size/oz): unknown 1 - Frequency: unknown 1 - Duration: unknown 1 - Last Use / Amount: unknown                  Sleep: Fair  Appetite:  Fair  Current Medications: Current Facility-Administered Medications  Medication Dose Route Frequency Provider Last Rate Last Dose  . acetaminophen (TYLENOL) tablet 650 mg  650 mg Oral Q6H PRN Laveda AbbeLaurie Britton Parks, NP      . alum & mag hydroxide-simeth (MAALOX/MYLANTA) 200-200-20 MG/5ML suspension 30 mL  30 mL Oral Q4H PRN Laveda AbbeLaurie Britton Parks, NP   30 mL at 10/26/15 1825  . hydrOXYzine (ATARAX/VISTARIL) tablet 25 mg  25 mg Oral Q6H PRN Laveda AbbeLaurie Britton Parks, NP      . levothyroxine (SYNTHROID, LEVOTHROID) tablet 25 mcg  25 mcg Oral QAC breakfast Beau Fanny, FNP   25 mcg at 10/28/15 [phone]  . lip balm (CARMEX) ointment   Topical PRN Jomarie Longs, MD      . magnesium hydroxide (MILK OF MAGNESIA) suspension 30 mL  30 mL Oral Daily PRN Laveda Abbe, NP      . nicotine polacrilex (NICORETTE) gum 2 mg  2 mg Oral PRN Jomarie Longs, MD      . risperiDONE (RISPERDAL) tablet 1 mg  1 mg Oral BID Beau Fanny, FNP   1 mg at 10/28/15 1646  . traZODone (DESYREL) tablet 50 mg  50 mg Oral QHS PRN,MR X 1 John C Withrow, FNP      . valACYclovir (VALTREX) tablet 1,000 mg  1,000 mg Oral Q12H Beau Fanny, FNP   1,000 mg at 10/28/15 9604    Lab Results:  No results found  for this or any previous visit (from the past 48 hour(s)).  Blood Alcohol level:  Lab Results  Component Value Date   ETH <5 10/25/2015    Metabolic Disorder Labs: Lab Results  Component Value Date   HGBA1C 5.2 10/26/2015   MPG 103 10/26/2015   No results found for: PROLACTIN Lab Results  Component Value Date   CHOL 148 10/26/2015   TRIG 69 10/26/2015   HDL 46 10/26/2015   CHOLHDL 3.2 10/26/2015   VLDL 14 10/26/2015   LDLCALC 88 10/26/2015    Physical Findings: AIMS: Facial and Oral Movements Muscles of Facial Expression: None, normal Lips and Perioral Area: None, normal Jaw: None, normal Tongue: None, normal,Extremity Movements Upper (arms, wrists, hands, fingers): None, normal Lower (legs, knees, ankles, toes): None, normal, Trunk Movements Neck, shoulders, hips: None, normal, Overall Severity Severity of abnormal movements (highest score from questions above): None, normal Incapacitation due to abnormal movements: None, normal Patient's awareness of abnormal movements (rate only patient's report): No Awareness, Dental Status Current problems with teeth and/or dentures?: No Does patient usually wear dentures?: No  CIWA:    COWS:     Musculoskeletal: Strength & Muscle Tone: within normal limits Gait & Station: normal Patient leans: N/A  Psychiatric Specialty Exam: Physical Exam  Review of Systems  Psychiatric/Behavioral: Negative for depression, hallucinations, substance abuse and suicidal ideas. The patient is not nervous/anxious.   All other systems reviewed and are negative.   Blood pressure 107/86, pulse 65, temperature 98.4 F (36.9 C), temperature source Oral, resp. rate 17, height 5\' 10"  (1.778 m), weight 65.8 kg (145 lb).Body mass index is 20.81 kg/m.  General Appearance: Casual and Fairly Groomed  Eye Contact:  Good  Speech:  Clear and Coherent and Normal Rate  Volume:  Normal  Mood:  Euthymic and anxiety greatly improved  Affect:  Appropriate  and Congruent  Thought Process:  Coherent, Goal Directed, Linear and Descriptions of Associations: Intact  Orientation:  Full (Time, Place, and Person)  Thought Content:  Discharge plans  Suicidal Thoughts:  No  Homicidal Thoughts:  No  Memory:  Immediate;   Fair Recent;   Fair Remote;   Fair  Judgement:  Fair  Insight:  Good  Psychomotor Activity:  Normal  Concentration:  Concentration: Good and Attention Span: Good  Recall:  [employer] of Knowledge:  Fair  Language:  Fair  Akathisia:  No  Handed:    AIMS (if indicated):     Assets:  Communication Skills Desire for Improvement Resilience Social Support Talents/Skills  ADL's:  Intact  Cognition:  WNL  Sleep:  Number of Hours: 6.25   Treatment Plan Summary: 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   Medications:  -Continue Vistaril 25mg  po q6h prn anxiety -Continue Risperidone 1mg  po bid for rumination -Continue Trazodone 50mg  po qhs prn insomnia -Continue Synthroid qam for hypothyroidism  Labs/tests:  -Baseline EKG (EKG not done by nursing; ordered again and made order STAT) -Reviewed  CBC, CMP, UDS, and all WNL except TSH 4.9 (hypothyroid) -Order prolactin, A1C, lipid panel for baseline with risperidone (Lab did not draw this, it says complete. Will reorder STAT); shows complete in orders but was not done  Beau Fanny, FNP 10/28/2015, 5:00 PM  I agree with findings and treatment plan of this patient

## 2015-10-28 NOTE — Progress Notes (Signed)
D: Pt denies SI/HI/AVH. Pt is pleasant and cooperative. Pt continues to be very paranoid. Pt is convinced that the police or the Government is following him and planting things in his house. Pt denies that there is anything wrong with him " I'm not Crazy".   A: Pt was offered support and encouragement. Pt was given scheduled medications. Pt was encourage to attend groups. Q 15 minute checks were done for safety.   R:Pt receptive to treatment and safety maintained on unit.

## 2015-10-29 DIAGNOSIS — F4310 Post-traumatic stress disorder, unspecified: Secondary | ICD-10-CM

## 2015-10-29 DIAGNOSIS — F17210 Nicotine dependence, cigarettes, uncomplicated: Secondary | ICD-10-CM

## 2015-10-29 MED ORDER — HYDROXYZINE HCL 25 MG PO TABS: 25.0000 mg | ORAL_TABLET | Freq: Four times a day (QID) | ORAL | 0 refills | Status: DC | PRN

## 2015-10-29 MED ORDER — NICOTINE POLACRILEX 2 MG MT GUM: 2.0000 mg | CHEWING_GUM | OROMUCOSAL | 0 refills | Status: DC | PRN

## 2015-10-29 MED ORDER — LEVOTHYROXINE SODIUM 25 MCG PO TABS: 25.0000 ug | ORAL_TABLET | Freq: Every day | ORAL | 0 refills | Status: DC

## 2015-10-29 MED ORDER — VALACYCLOVIR HCL 1 G PO TABS: 1000.0000 mg | ORAL_TABLET | Freq: Two times a day (BID) | ORAL | 0 refills | Status: AC

## 2015-10-29 MED ORDER — TRAZODONE HCL 50 MG PO TABS: 50.0000 mg | ORAL_TABLET | Freq: Every evening | ORAL | 0 refills | Status: AC | PRN

## 2015-10-29 MED ORDER — RISPERIDONE 1 MG PO TABS: 1.0000 mg | ORAL_TABLET | Freq: Two times a day (BID) | ORAL | 0 refills | Status: DC

## 2015-10-29 NOTE — [facility] Suicide Risk Assessment (Signed)
[facility]   Principal Problem: PTSD (post-traumatic stress disorder) Discharge Diagnoses:  Patient Active Problem List   Diagnosis Date Noted  . PTSD (post-traumatic stress disorder) [F43.10] 10/27/2015    Total Time spent with patient: 30 minutes  Musculoskeletal: Strength & Muscle Tone: within normal limits Gait & Station: normal Patient leans: N/A  Psychiatric Specialty Exam: Review of Systems  Psychiatric/Behavioral: Negative for depression and suicidal ideas.  All other systems reviewed and are negative.   Blood pressure 121/68, pulse 79, temperature 98.1 F (36.7 C), temperature source Oral, resp. rate 18, height 5\' 10"  (1.778 m), weight 65.8 kg (145 lb).Body mass index is 20.81 kg/m.  General Appearance: Casual  Eye Contact::  Fair  Speech:  Clear and Coherent409  Volume:  Normal  Mood:  Euthymic  Affect:  Appropriate  Thought Process:  Goal Directed and Descriptions of Associations: Intact  Orientation:  Full (Time, Place, and Person)  Thought Content:  WDL  Suicidal Thoughts:  No  Homicidal Thoughts:  No  Memory:  Immediate;   Fair Recent;   Fair Remote;   Fair  Judgement:  Fair  Insight:  Fair  Psychomotor Activity:  Normal  Concentration:  Fair  Recall:  FiservFair  Fund of Knowledge:Fair  Language: Fair  Akathisia:  No  Handed:  Right  AIMS (if indicated):     Assets:  Desire for Improvement  Sleep:  Number of Hours: 6.25  Cognition: WNL  ADL's:  Intact   Mental Status Per Nursing Assessment::   On Admission:     Demographic Factors:  Male  Loss Factors: NA  Historical Factors: Impulsivity  Risk Reduction Factors:   Positive social support  Continued Clinical Symptoms:  Previous Psychiatric Diagnoses and Treatments  Cognitive Features That Contribute To Risk:  None    Suicide Risk:  Minimal: No identifiable suicidal ideation.  Patients presenting with no risk factors but with morbid ruminations; may be  classified as minimal risk based on the severity of the depressive symptoms  Follow-up Information    [facility]   Contact information: [address] [place] [address] [phone]           Plan Of Care/Follow-up recommendations:  Activity:  no restrictions Diet:  regular  Bernette Seeman, MD 10/29/2015, 10:00 AM
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
## 2015-10-29 NOTE — Progress Notes (Signed)
Recreation Therapy Notes  Date: 10/29/15 Time: 1000 Location: 500 Hall Dayroom  Group Topic: Self-Esteem  Goal Area(s) Addresses:  Patient will identify positive ways to increase self-esteem. Patient will verbalize benefit of increased self-esteem.  Behavioral Response: Engaged  Intervention: Worksheets, colored pencils  Activity: How I See Me.  LRT introduced the concept of self-esteem to the patients.  Patients were given worksheets with blank faces on them.  Patients were to draw or write how they see themselves on the face.  On the back of the mask, they were to describe how they feel others see them.  Education:  Self-Esteem, Building control surveyorDischarge Planning.   Education Outcome: Acknowledges education/In group clarification offered/Needs additional education  Clinical Observations/Feedback: Pt arrived late.  Pt described himself as not hard to get along with and easy going.  He expressed others see him as "kind, often funny and outgoing".   Caroll RancherMarjette Jessee Mezera, LRT/CTRS    Caroll RancherLindsay, Marynell Bies A 10/29/2015 12:39 PM

## 2015-10-29 NOTE — Discharge Summary (Signed)
Physician Discharge Summary Note  Patient:  Anthony Ortiz is an 30 y.o., male MRN:  [id number] DOB:  1985-09-15 Patient phone:  [phone] (home)  Patient address:   [address] [place] [address],  Total Time spent with patient: 30 minutes  Date of Admission:  10/26/2015 Date of Discharge: 10/29/2015  Reason for Admission:    Principal Problem: PTSD (post-traumatic stress disorder) Discharge Diagnoses: Patient Active Problem List   Diagnosis Date Noted  . PTSD (post-traumatic stress disorder) [F43.10] 10/27/2015    Past Psychiatric History: see HPI    Past Medical History:  Past Medical History:  Diagnosis Date  . Kidney stones     Past Surgical History:  Procedure Laterality Date  . URETER SURGERY     Family History: History reviewed. No pertinent family history. Family Psychiatric  History:  see HPI Social History:  History  Alcohol Use No     History  Drug Use No    Social History   Social History  . Marital status: Legally Separated    Spouse name: N/A  . Number of children: N/A  . Years of education: N/A   Social History Main Topics  . Smoking status: Current Every Day Smoker    Packs/day: 1.00    Types: Cigarettes  . Smokeless tobacco: Never Used  . Alcohol use No  . Drug use: No  . Sexual activity: Not Asked   Other Topics Concern  . None   Social History Narrative  . None    Hospital Course:  Anthony Ortiz an 30 y.o.male who presented to ED involuntarily for assessment. Pt reported he and family members were at [facility] for outpatient services when Anthony Ortiz IVCd him.  Per wife, patient believes that there is a cojspirace that government is trying to harm him.    Anthony Ortiz was admitted for PTSD (post-traumatic stress disorder) and crisis management.  Patient was treated with medications with their indications listed below in detail under Medication List.  Medical problems were identified and treated as needed.  Home  medications were restarted as appropriate.  Improvement was monitored by observation and Anthony Ortiz daily report of symptom reduction.  Emotional and mental status was monitored by daily self inventory reports completed by Anthony Ortiz.  Patient reported continued improvement, denied any new concerns.  Patient had been compliant on medications and denied side effects.  Support and encouragement was provided.    Patient encouraged to attend groups to help with recognizing triggers of emotional crises and de-stabilizations.  Patient encouraged to attend group to help identify the positive things in life that would help in dealing with feelings of loss, depression and unhealthy or abusive tendencies.         Anthony Ortiz was evaluated by the treatment team for stability and plans for continued recovery upon discharge.  Patient was offered further treatment options upon discharge including Residential, Intensive Outpatient and Outpatient treatment. Patient will follow up with agency listed below for medication management and counseling.  Encouraged patient to maintain satisfactory support network and home environment.  Advised to adhere to medication compliance and outpatient treatment follow up.  Prescriptions provided.       Employment, transportation, bed availability, health status, family support, and any pending legal issues were also considered during patient's hospital stay.  Upon completion of this admission the patient was both mentally and medically stable for discharge denying suicidal/homicidal ideation, auditory/visual/tactile hallucinations, delusional thoughts and  paranoia.      Physical Findings: AIMS: Facial and Oral Movements Muscles of Facial Expression: None, normal Lips and Perioral Area: None, normal Jaw: None, normal Tongue: None, normal,Extremity Movements Upper (arms, wrists, hands, fingers): None, normal Lower (legs, knees, ankles, toes): None,  normal, Trunk Movements Neck, shoulders, hips: None, normal, Overall Severity Severity of abnormal movements (highest score from questions above): None, normal Incapacitation due to abnormal movements: None, normal Patient's awareness of abnormal movements (rate only patient's report): No Awareness, Dental Status Current problems with teeth and/or dentures?: No Does patient usually wear dentures?: No  CIWA:  CIWA-Ar Total: 1 COWS:  COWS Total Score: 1  Musculoskeletal: Strength & Muscle Tone: within normal limits Gait & Station: normal Patient leans: N/A  Psychiatric Specialty Exam: Physical Exam  Nursing note and vitals reviewed. Psychiatric: He has a normal mood and affect. His speech is normal and behavior is normal. Judgment and thought content normal. Cognition and memory are normal.    ROS  Blood pressure 121/68, pulse 79, temperature 98.1 F (36.7 C), temperature source Oral, resp. rate 18, height 5\' 10"  (1.778 m), weight 65.8 kg (145 lb).Body mass index is 20.81 kg/m.   Have you used any form of tobacco in the last 30 days? (Cigarettes, Smokeless Tobacco, Cigars, and/or Pipes): Yes  Has this patient used any form of tobacco in the last 30 days? (Cigarettes, Smokeless Tobacco, Cigars, and/or Pipes) Yes, N/A  Blood Alcohol level:  Lab Results  Component Value Date   ETH <5 10/25/2015    Metabolic Disorder Labs:  Lab Results  Component Value Date   HGBA1C 5.2 10/26/2015   MPG 103 10/26/2015   No results found for: PROLACTIN Lab Results  Component Value Date   CHOL 148 10/26/2015   TRIG 69 10/26/2015   HDL 46 10/26/2015   CHOLHDL 3.2 10/26/2015   VLDL 14 10/26/2015   LDLCALC 88 10/26/2015    See Psychiatric Specialty Exam and Suicide Risk Assessment completed by Attending Physician prior to discharge.  Discharge destination:  Home  Is patient on multiple antipsychotic therapies at discharge:  No   Has Patient had three or more failed trials of antipsychotic  monotherapy by history:  No  Recommended Plan for Multiple Antipsychotic Therapies: NA     Medication List    TAKE these medications     Indication  hydrOXYzine 25 MG tablet Commonly known as:  ATARAX/VISTARIL Take 1 tablet (25 mg total) by mouth every 6 (six) hours as needed for anxiety.  Indication:  Anxiety Neurosis   levothyroxine 25 MCG tablet Commonly known as:  SYNTHROID, LEVOTHROID Take 1 tablet (25 mcg total) by mouth daily before breakfast. Start taking on:  10/30/2015  Indication:  Underactive Thyroid   nicotine polacrilex 2 MG gum Commonly known as:  NICORETTE Take 1 each (2 mg total) by mouth as needed for smoking cessation.  Indication:  Nicotine Addiction   risperiDONE 1 MG tablet Commonly known as:  RISPERDAL Take 1 tablet (1 mg total) by mouth 2 (two) times daily. What changed:  medication strength  how much to take  Indication:  mood stabilization   traZODone 50 MG tablet Commonly known as:  DESYREL Take 1 tablet (50 mg total) by mouth at bedtime as needed and may repeat dose one time if needed for sleep.  Indication:  Trouble Sleeping   valACYclovir 1000 MG tablet Commonly known as:  VALTREX Take 1 tablet (1,000 mg total) by mouth every 12 (twelve) hours.  Indication:  herpes      Follow-up Information    [facility] Follow up on 10/30/2015.   Why:  Hosptal discharge follow up appointment on Tuesday Oct 24 at 9 AM.  Please call to cancel/reschedule if needed.   Contact information: [address] [place] [address] [phone]           Follow-up recommendations:  Activity:  as tol Diet:  as tol  Comments:  1.  Take all your medications as prescribed.   2.  Report any adverse side effects to outpatient provider. 3.  Patient instructed to not use alcohol or illegal drugs while on prescription medicines. 4.  In the event of worsening symptoms, instructed patient to call 911, the crisis hotline or go to nearest  emergency room for evaluation of symptoms.  Signed: Lindwood QuaSheila May Effie Janoski, NP [facility] 10/29/2015, 1:49 PM

## 2015-10-29 NOTE — Progress Notes (Signed)
D:  Patient's self inventory sheet, patient sleeps good, no sleep medication given.  Good appetite, normal energy level, good concentration.  Denied depression, hopeless and anxiety.  Denied withdrawals.  Denied SI.  Denied physical problems.  Denied pain.  Goal is to work on relationship with wife.  Plans to control anger and what he says to wife.  Does have discharge plans. A:  Medications administered per MD orders.  Emotional support and encouragement given patient. R:  Denied SI and HI, contracts for safety.  Denied A/V hallucinations.  Safety maintained with 15 minute checks.

## 2015-10-29 NOTE — Progress Notes (Signed)
Discharge Note:  Patient discharged home with bus ticket.  Patient denied SI and HI.  Denied A/V hallucinations.  Suicide prevention information given and discussed with patient who stated he understood and had no questions.  Patient stated he received all his belongings, jacket, shirt, belt, key, shoe laces, money, lighter, hat, wallet, money, misc cards, prescriptions, med.  Patient stated he [facility].  All required discharge information given to patient at discharge.

## 2015-10-29 NOTE — BHH Group Notes (Signed)
B[facility] LCSW Group Therapy  10/29/2015 1:15pm  Type of Therapy:  Group Therapy vercoming Obstacles  Pt did not attend, declined invitation.   Chad CordialLauren Carter, LCSWA 10/29/2015 2:22 PM
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
## 2015-10-29 NOTE — Progress Notes (Signed)
  [facility] :  Will you be returning to the same living situation after discharge:  Yes,  grandparents At discharge, do you have transportation home?: Yes,  bus pass and PART fare Do you have the ability to pay for your medications: Yes,  no concerns expressed, referred to provider who can assist w low cost medication access  Release of information consent forms completed and in the chart;  Patient's signature needed at discharge.  Patient to Follow up at: Follow-up Information    [facility] Follow up on 10/30/2015.   Why:  Hosptal discharge follow up appointment on Tuesday Oct 24 at 9 AM.  Please call to cancel/reschedule if needed.   Contact information: [address] [place] [address] [phone]           Next level of care provider has access to [facility] Link:no  Safety Planning and Suicide Prevention discussed: Yes,  reviewed w patient, CSW attempting to contact mother w patient permission  Have you used any form of tobacco in the last 30 days? (Cigarettes, Smokeless Tobacco, Cigars, and/or Pipes): Yes  Has patient been referred to the Quitline?: Patient refused referral  Patient has been referred for addiction treatment: Yes  Sallee Langenne C Ranvir Renovato 10/29/2015, 11:13 AM

## 2015-10-29 NOTE — Tx Team (Signed)
Interdisciplinary Treatment and Diagnostic Plan Update  10/29/2015 Time of Session: 9 AM Anthony Ortiz MRN: [id number]  Principal Diagnosis: PTSD (post-traumatic stress disorder)  Secondary Diagnoses: Principal Problem:   PTSD (post-traumatic stress disorder)   Current Medications:  Current Facility-Administered Medications  Medication Dose Route Frequency Provider Last Rate Last Dose  . acetaminophen (TYLENOL) tablet 650 mg  650 mg Oral Q6H PRN Laveda AbbeLaurie Britton Parks, NP      . alum & mag hydroxide-simeth (MAALOX/MYLANTA) 200-200-20 MG/5ML suspension 30 mL  30 mL Oral Q4H PRN Laveda AbbeLaurie Britton Parks, NP   30 mL at 10/26/15 1825  . hydrOXYzine (ATARAX/VISTARIL) tablet 25 mg  25 mg Oral Q6H PRN Laveda AbbeLaurie Britton Parks, NP      . levothyroxine (SYNTHROID, LEVOTHROID) tablet 25 mcg  25 mcg Oral QAC breakfast Beau FannyJohn C Withrow, FNP   25 mcg at 10/29/15 [id number]  . lip balm (CARMEX) ointment   Topical PRN Jomarie LongsSaramma Eappen, MD      . magnesium hydroxide (MILK OF MAGNESIA) suspension 30 mL  30 mL Oral Daily PRN Laveda AbbeLaurie Britton Parks, NP      . nicotine polacrilex (NICORETTE) gum 2 mg  2 mg Oral PRN Jomarie LongsSaramma Eappen, MD      . risperiDONE (RISPERDAL) tablet 1 mg  1 mg Oral BID Beau FannyJohn C Withrow, FNP   1 mg at 10/29/15 0916  . traZODone (DESYREL) tablet 50 mg  50 mg Oral QHS PRN,MR X 1 John C Withrow, FNP      . valACYclovir (VALTREX) tablet 1,000 mg  1,000 mg Oral Q12H Beau FannyJohn C Withrow, FNP   1,000 mg at 10/29/15 [id number]   PTA Medications: Prescriptions Prior to Admission  Medication Sig Dispense Refill Last Dose  . risperiDONE (RISPERDAL) 2 MG tablet Take 2 mg by mouth 2 (two) times daily.  0 Past Week at Unknown time    Patient Stressors: Marital or family conflict Medication change or noncompliance  Patient Strengths: Ability for insight Average or above average intelligence Capable of independent living General fund of knowledge  Treatment Modalities: Medication Management, Group therapy, Case management,   1 to 1 session with clinician, Psychoeducation, Recreational therapy.   Physician Treatment Plan for Primary Diagnosis: PTSD (post-traumatic stress disorder) Long Term Goal(s): Improvement in symptoms so as ready for discharge Improvement in symptoms so as ready for discharge   Short Term Goals: Ability to identify changes in lifestyle to reduce recurrence of condition will improve Ability to verbalize feelings will improve Ability to disclose and discuss suicidal ideas Ability to demonstrate self-control will improve Ability to identify and develop effective coping behaviors will improve Ability to maintain clinical measurements within normal limits will improve Compliance with prescribed medications will improve Ability to identify changes in lifestyle to reduce recurrence of condition will improve Ability to verbalize feelings will improve Ability to disclose and discuss suicidal ideas Ability to demonstrate self-control will improve Ability to identify and develop effective coping behaviors will improve Ability to maintain clinical measurements within normal limits will improve Compliance with prescribed medications will improve  Medication Management: Evaluate patient's response, side effects, and tolerance of medication regimen.  Therapeutic Interventions: 1 to 1 sessions, Unit Group sessions and Medication administration.  Evaluation of Outcomes: Adequate for Discharge  Physician Treatment Plan for Secondary Diagnosis: Principal Problem:   PTSD (post-traumatic stress disorder)  Long Term Goal(s): Improvement in symptoms so as ready for discharge Improvement in symptoms so as ready for discharge   Short Term Goals: Ability to identify changes in  lifestyle to reduce recurrence of condition will improve Ability to verbalize feelings will improve Ability to disclose and discuss suicidal ideas Ability to demonstrate self-control will improve Ability to identify and develop  effective coping behaviors will improve Ability to maintain clinical measurements within normal limits will improve Compliance with prescribed medications will improve Ability to identify changes in lifestyle to reduce recurrence of condition will improve Ability to verbalize feelings will improve Ability to disclose and discuss suicidal ideas Ability to demonstrate self-control will improve Ability to identify and develop effective coping behaviors will improve Ability to maintain clinical measurements within normal limits will improve Compliance with prescribed medications will improve     Medication Management: Evaluate patient's response, side effects, and tolerance of medication regimen.  Therapeutic Interventions: 1 to 1 sessions, Unit Group sessions and Medication administration.  Evaluation of Outcomes: Adequate for Discharge   RN Treatment Plan for Primary Diagnosis: PTSD (post-traumatic stress disorder) Long Term Goal(s): Knowledge of disease and therapeutic regimen to maintain health will improve  Short Term Goals: Ability to participate in decision making will improve and Ability to identify and develop effective coping behaviors will improve  Medication Management: RN will administer medications as ordered by provider, will assess and evaluate patient's response and provide education to patient for prescribed medication. RN will report any adverse and/or side effects to prescribing provider.  Therapeutic Interventions: 1 on 1 counseling sessions, Psychoeducation, Medication administration, Evaluate responses to treatment, Monitor vital signs and CBGs as ordered, Perform/monitor CIWA, COWS, AIMS and Fall Risk screenings as ordered, Perform wound care treatments as ordered.  Evaluation of Outcomes: Adequate for Discharge   LCSW Treatment Plan for Primary Diagnosis: PTSD (post-traumatic stress disorder) Long Term Goal(s): Safe transition to appropriate next level of care at  discharge, Engage patient in therapeutic group addressing interpersonal concerns.  Short Term Goals: Engage patient in aftercare planning with referrals and resources, Increase ability to appropriately verbalize feelings and Increase skills for wellness and recovery  Therapeutic Interventions: Assess for all discharge needs, 1 to 1 time with Social worker, Explore available resources and support systems, Assess for adequacy in community support network, Educate family and significant other(s) on suicide prevention, Complete Psychosocial Assessment, Interpersonal group therapy.  Evaluation of Outcomes: Adequate for Discharge   Progress in Treatment: Attending groups: Yes. Participating in groups: Yes. Taking medication as prescribed: Yes. Toleration medication: Yes. Family/Significant other contact made: No, will contact:  mother w patient consent, one attempt made so far Patient understands diagnosis: Yes. Discussing patient identified problems/goals with staff: Yes. Medical problems stabilized or resolved: Yes. Denies suicidal/homicidal ideation: Yes. Issues/concerns per patient self-inventory: No. Other: na  New problem(s) identified: No, Describe:  none at this time  New Short Term/Long Term Goal(s):  Discharge Plan or Barriers: aftercare in place  Reason for Continuation of Hospitalization: Delusions  Medication stabilization Suicidal ideation  Estimated Length of Stay: discharge today  Attendees: Patient: 10/29/2015 11:09 AM  Physician: Elvera Maria MD 10/29/2015 11:09 AM  Nursing: Meriam Sprague RN, Jan RN 10/29/2015 11:09 AM  RN Care Manager: 10/29/2015 11:09 AM  Social Worker: Governor Rooks LCSW 10/29/2015 11:09 AM  Recreational Therapist: Huston Foley LRT 10/29/2015 11:09 AM  Other: Marella Chimes P4CC 10/29/2015 11:09 AM  Other:  10/29/2015 11:09 AM  Other: 10/29/2015 11:09 AM    Scribe for Treatment Team: Sallee Lange, LCSW 10/29/2015 11:09 AM

## 2015-10-30 LAB — PROLACTIN: Prolactin: 49.6 ng/mL — ABNORMAL HIGH (ref 4.0–15.2)

## 2015-10-30 LAB — HEMOGLOBIN A1C
HEMOGLOBIN A1C: 5.3 % (ref 4.8–5.6)
Mean Plasma Glucose: 105 mg/dL

## 2015-11-16 MED FILL — SUBOXONE 8 MG-2 MG SL FILM: 8-2 | 28 days supply | Qty: 56 | Fill #0

## 2016-04-26 ENCOUNTER — Emergency Department (HOSPITAL_COMMUNITY): Payer: No Typology Code available for payment source

## 2016-04-26 ENCOUNTER — Emergency Department (HOSPITAL_COMMUNITY)
Admission: EM | Admit: 2016-04-26 | Discharge: 2016-04-26 | Disposition: A | Discharge status: Home / Self Care | Payer: No Typology Code available for payment source | Attending: Emergency Medicine | Admitting: Emergency Medicine

## 2016-04-26 ENCOUNTER — Encounter (HOSPITAL_COMMUNITY): Payer: Self-pay

## 2016-04-26 DIAGNOSIS — S62355A Nondisplaced fracture of shaft of fourth metacarpal bone, left hand, initial encounter for closed fracture: Secondary | ICD-10-CM

## 2016-04-26 DIAGNOSIS — S6992XA Unspecified injury of left wrist, hand and finger(s), initial encounter: Secondary | ICD-10-CM | POA: Diagnosis present

## 2016-04-26 DIAGNOSIS — Y999 Unspecified external cause status: Secondary | ICD-10-CM | POA: Insufficient documentation

## 2016-04-26 DIAGNOSIS — F17210 Nicotine dependence, cigarettes, uncomplicated: Secondary | ICD-10-CM | POA: Insufficient documentation

## 2016-04-26 DIAGNOSIS — Y939 Activity, unspecified: Secondary | ICD-10-CM | POA: Insufficient documentation

## 2016-04-26 DIAGNOSIS — Y92410 Unspecified street and highway as the place of occurrence of the external cause: Secondary | ICD-10-CM | POA: Insufficient documentation

## 2016-04-26 MED ORDER — IBUPROFEN 400 MG PO TABS
600.0000 mg | ORAL_TABLET | Freq: Once | ORAL | Status: AC
  Administered 2016-04-26: 600 mg via ORAL
  Filled 2016-04-26: qty 1

## 2016-04-26 MED ORDER — IBUPROFEN 600 MG PO TABS: 600.0000 mg | ORAL_TABLET | Freq: Four times a day (QID) | ORAL | 0 refills | Status: DC | PRN

## 2016-04-26 NOTE — Progress Notes (Signed)
Orthopedic Tech Progress Note Patient Details:  Anthony Ortiz October 17, 1985 [id number]  Ortho Devices Type of Ortho Device: Ace wrap, Ulna gutter splint, Arm sling Ortho Device/Splint Interventions: Application   Saul Fordyce 04/26/2016, 4:53 PM
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
## 2016-04-26 NOTE — ED Notes (Signed)
Ortho tech called to apply splint to left arm.
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
## 2017-02-15 ENCOUNTER — Emergency Department (HOSPITAL_COMMUNITY)
Admission: EM | Admit: 2017-02-15 | Discharge: 2017-02-15 | Discharge status: Against Medical Advice | Payer: Medicaid Other | Attending: Emergency Medicine | Admitting: Emergency Medicine

## 2017-02-15 ENCOUNTER — Encounter (HOSPITAL_COMMUNITY): Payer: Self-pay | Admitting: *Deleted

## 2017-02-15 DIAGNOSIS — Z5321 Procedure and treatment not carried out due to patient leaving prior to being seen by health care provider: Secondary | ICD-10-CM | POA: Insufficient documentation

## 2017-02-15 DIAGNOSIS — Z041 Encounter for examination and observation following transport accident: Secondary | ICD-10-CM | POA: Diagnosis present

## 2017-02-15 NOTE — ED Notes (Signed)
Pt told registration they were leaving 
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
## 2017-02-16 NOTE — ED Notes (Signed)
02/16/2017,  Attempted follow-up call, no answer.
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
## 2020-01-11 ENCOUNTER — Other Ambulatory Visit (HOSPITAL_COMMUNITY): Payer: Self-pay

## 2020-02-29 ENCOUNTER — Other Ambulatory Visit (HOSPITAL_COMMUNITY): Payer: Self-pay

## 2020-04-10 ENCOUNTER — Other Ambulatory Visit (HOSPITAL_COMMUNITY): Payer: Self-pay

## 2020-04-10 MED ORDER — RISPERDAL CONSTA 50 MG IM SRER: 50.0000 mg | INTRAMUSCULAR | 0 refills | Status: AC

## 2020-04-11 ENCOUNTER — Other Ambulatory Visit (HOSPITAL_COMMUNITY): Payer: Self-pay

## 2020-04-11 MED ORDER — RISPERDAL CONSTA 50 MG IM SRER
50.0000 mg | INTRAMUSCULAR | 2 refills | Status: DC
  Filled 2020-04-11: qty 2, 28d supply, fill #0
  Filled 2020-05-08: qty 2, 28d supply, fill #1
  Filled 2020-06-07: qty 2, 28d supply, fill #2

## 2020-04-12 ENCOUNTER — Other Ambulatory Visit (HOSPITAL_COMMUNITY): Payer: Self-pay

## 2020-05-08 ENCOUNTER — Other Ambulatory Visit (HOSPITAL_COMMUNITY): Payer: Self-pay

## 2020-05-15 ENCOUNTER — Other Ambulatory Visit (HOSPITAL_COMMUNITY): Payer: Self-pay

## 2020-05-15 MED ORDER — HYDROXYZINE PAMOATE 25 MG PO CAPS
ORAL_CAPSULE | ORAL | 0 refills | Status: DC
  Filled 2020-05-15: qty 90, 15d supply, fill #0

## 2020-06-07 ENCOUNTER — Other Ambulatory Visit (HOSPITAL_COMMUNITY): Payer: Self-pay

## 2020-07-04 ENCOUNTER — Other Ambulatory Visit (HOSPITAL_COMMUNITY): Payer: Self-pay

## 2020-07-04 MED ORDER — RISPERDAL CONSTA 50 MG IM SRER
INTRAMUSCULAR | 5 refills | Status: DC
  Filled 2020-07-04: qty 2, 30d supply, fill #0
  Filled 2020-09-20: qty 2, 30d supply, fill #1
  Filled 2020-12-07: qty 2, 30d supply, fill #2

## 2020-07-04 MED ORDER — HYDROXYZINE PAMOATE 25 MG PO CAPS
ORAL_CAPSULE | ORAL | 5 refills | Status: DC
  Filled 2020-07-04: qty 90, 15d supply, fill #0

## 2020-07-05 ENCOUNTER — Other Ambulatory Visit (HOSPITAL_COMMUNITY): Payer: Self-pay

## 2020-07-10 ENCOUNTER — Other Ambulatory Visit (HOSPITAL_COMMUNITY): Payer: Self-pay

## 2020-07-12 ENCOUNTER — Other Ambulatory Visit (HOSPITAL_COMMUNITY): Payer: Self-pay

## 2020-09-20 ENCOUNTER — Other Ambulatory Visit (HOSPITAL_COMMUNITY): Payer: Self-pay

## 2020-09-21 ENCOUNTER — Other Ambulatory Visit (HOSPITAL_COMMUNITY): Payer: Self-pay

## 2020-10-01 ENCOUNTER — Other Ambulatory Visit (HOSPITAL_COMMUNITY): Payer: Self-pay

## 2020-10-02 ENCOUNTER — Other Ambulatory Visit (HOSPITAL_COMMUNITY): Payer: Self-pay

## 2020-10-04 ENCOUNTER — Other Ambulatory Visit (HOSPITAL_COMMUNITY): Payer: Self-pay

## 2020-10-08 ENCOUNTER — Other Ambulatory Visit (HOSPITAL_COMMUNITY): Payer: Self-pay

## 2020-12-07 ENCOUNTER — Other Ambulatory Visit (HOSPITAL_COMMUNITY): Payer: Self-pay

## 2020-12-10 ENCOUNTER — Other Ambulatory Visit (HOSPITAL_COMMUNITY): Payer: Self-pay

## 2021-01-02 ENCOUNTER — Other Ambulatory Visit (HOSPITAL_COMMUNITY): Payer: Self-pay

## 2021-01-02 MED ORDER — RISPERDAL CONSTA 50 MG IM SRER
INTRAMUSCULAR | 5 refills | Status: AC
  Filled 2021-01-02: qty 3, 30d supply, fill #0
  Filled 2021-01-17: qty 2, 28d supply, fill #0

## 2021-01-02 MED ORDER — RISPERIDONE 1 MG PO TABS
ORAL_TABLET | ORAL | 0 refills | Status: AC
  Filled 2021-01-02: qty 30, 30d supply, fill #0

## 2021-01-02 MED ORDER — HYDROXYZINE PAMOATE 25 MG PO CAPS
ORAL_CAPSULE | ORAL | 5 refills | Status: AC
  Filled 2021-01-02: qty 90, 30d supply, fill #0

## 2021-01-10 ENCOUNTER — Other Ambulatory Visit (HOSPITAL_COMMUNITY): Payer: Self-pay

## 2021-01-17 ENCOUNTER — Other Ambulatory Visit (HOSPITAL_COMMUNITY): Payer: Self-pay

## 2021-01-18 ENCOUNTER — Other Ambulatory Visit (HOSPITAL_COMMUNITY): Payer: Self-pay

## 2021-01-30 ENCOUNTER — Other Ambulatory Visit (HOSPITAL_COMMUNITY): Payer: Self-pay

## 2021-01-30 MED ORDER — RISPERDAL CONSTA 50 MG IM SRER
INTRAMUSCULAR | 5 refills | Status: DC
Start: 2021-01-30 — End: 2021-10-11
  Filled 2021-01-30 – 2021-07-16 (×3): qty 3, 30d supply, fill #0

## 2021-01-31 ENCOUNTER — Other Ambulatory Visit (HOSPITAL_COMMUNITY): Payer: Self-pay

## 2021-02-01 ENCOUNTER — Other Ambulatory Visit (HOSPITAL_COMMUNITY): Payer: Self-pay

## 2021-02-02 ENCOUNTER — Other Ambulatory Visit (HOSPITAL_COMMUNITY): Payer: Self-pay

## 2021-02-09 ENCOUNTER — Other Ambulatory Visit (HOSPITAL_COMMUNITY): Payer: Self-pay

## 2021-02-12 ENCOUNTER — Other Ambulatory Visit (HOSPITAL_COMMUNITY): Payer: Self-pay

## 2021-02-15 ENCOUNTER — Other Ambulatory Visit (HOSPITAL_COMMUNITY): Payer: Self-pay

## 2021-02-21 ENCOUNTER — Other Ambulatory Visit (HOSPITAL_COMMUNITY): Payer: Self-pay

## 2021-02-25 ENCOUNTER — Other Ambulatory Visit (HOSPITAL_COMMUNITY): Payer: Self-pay

## 2021-05-10 ENCOUNTER — Other Ambulatory Visit (HOSPITAL_COMMUNITY): Payer: Self-pay

## 2021-05-10 MED ORDER — RISPERDAL CONSTA 50 MG IM SRER
INTRAMUSCULAR | 5 refills | Status: DC
  Filled 2021-05-10: qty 2, 30d supply, fill #0

## 2021-05-10 MED ORDER — HYDROXYZINE HCL 50 MG PO TABS
ORAL_TABLET | ORAL | 0 refills | Status: DC
  Filled 2021-05-10: qty 60, 30d supply, fill #0

## 2021-05-13 ENCOUNTER — Other Ambulatory Visit (HOSPITAL_COMMUNITY): Payer: Self-pay

## 2021-05-14 ENCOUNTER — Other Ambulatory Visit (HOSPITAL_COMMUNITY): Payer: Self-pay

## 2021-07-16 ENCOUNTER — Other Ambulatory Visit (HOSPITAL_COMMUNITY): Payer: Self-pay

## 2021-07-22 ENCOUNTER — Other Ambulatory Visit (HOSPITAL_COMMUNITY): Payer: Self-pay

## 2021-07-23 ENCOUNTER — Other Ambulatory Visit (HOSPITAL_COMMUNITY): Payer: Self-pay

## 2021-09-12 ENCOUNTER — Other Ambulatory Visit (HOSPITAL_COMMUNITY): Payer: Self-pay

## 2021-09-12 MED ORDER — HYDROXYZINE HCL 50 MG PO TABS
ORAL_TABLET | ORAL | 0 refills | Status: DC
  Filled 2021-09-12: qty 60, 30d supply, fill #0

## 2021-09-12 MED ORDER — RISPERDAL CONSTA 50 MG IM SRER
INTRAMUSCULAR | 5 refills | Status: AC
  Filled 2021-09-12: qty 2, 30d supply, fill #0
  Filled 2021-11-06: qty 2, 30d supply, fill #1

## 2021-09-13 ENCOUNTER — Other Ambulatory Visit (HOSPITAL_COMMUNITY): Payer: Self-pay

## 2021-09-16 ENCOUNTER — Other Ambulatory Visit (HOSPITAL_COMMUNITY): Payer: Self-pay

## 2021-09-26 ENCOUNTER — Other Ambulatory Visit (HOSPITAL_COMMUNITY): Payer: Self-pay

## 2021-09-26 MED ORDER — PALIPERIDONE ER 3 MG PO TB24
3.0000 mg | ORAL_TABLET | Freq: Every day | ORAL | 0 refills | Status: AC
  Filled 2021-09-26: qty 30, 30d supply, fill #0

## 2021-10-11 ENCOUNTER — Ambulatory Visit (INDEPENDENT_AMBULATORY_CARE_PROVIDER_SITE_OTHER): Payer: Medicaid Other | Admitting: Medical

## 2021-10-11 ENCOUNTER — Encounter: Payer: Self-pay | Admitting: Medical

## 2021-10-11 ENCOUNTER — Other Ambulatory Visit (HOSPITAL_COMMUNITY)
Admission: RE | Admit: 2021-10-11 | Discharge: 2021-10-11 | Disposition: A | Discharge status: Home / Self Care | Payer: Medicaid Other | Source: Ambulatory Visit | Attending: Medical | Admitting: Medical

## 2021-10-11 VITALS — BP 100/70 | HR 66 | Temp 98.0°F | Resp 18 | Ht 72.0 in | Wt 134.0 lb

## 2021-10-11 DIAGNOSIS — Z202 Contact with and (suspected) exposure to infections with a predominantly sexual mode of transmission: Secondary | ICD-10-CM | POA: Diagnosis not present

## 2021-10-11 DIAGNOSIS — F1910 Other psychoactive substance abuse, uncomplicated: Secondary | ICD-10-CM

## 2021-10-11 DIAGNOSIS — Z0000 Encounter for general adult medical examination without abnormal findings: Secondary | ICD-10-CM

## 2021-10-11 DIAGNOSIS — R634 Abnormal weight loss: Secondary | ICD-10-CM

## 2021-10-11 DIAGNOSIS — E039 Hypothyroidism, unspecified: Secondary | ICD-10-CM

## 2021-10-11 DIAGNOSIS — K4090 Unilateral inguinal hernia, without obstruction or gangrene, not specified as recurrent: Secondary | ICD-10-CM | POA: Diagnosis not present

## 2021-10-11 DIAGNOSIS — F259 Schizoaffective disorder, unspecified: Secondary | ICD-10-CM

## 2021-10-11 DIAGNOSIS — R768 Other specified abnormal immunological findings in serum: Secondary | ICD-10-CM

## 2021-10-11 LAB — CBC WITH DIFFERENTIAL/PLATELET
Basophils Absolute: 0 10*3/uL (ref 0.0–0.1)
Basophils Relative: 0.7 % (ref 0.0–3.0)
Eosinophils Absolute: 0.1 10*3/uL (ref 0.0–0.7)
Eosinophils Relative: 2 % (ref 0.0–5.0)
HCT: 47.7 % (ref 39.0–52.0)
Hemoglobin: 15.8 g/dL (ref 13.0–17.0)
Lymphocytes Relative: 30.6 % (ref 12.0–46.0)
Lymphs Abs: 1.9 10*3/uL (ref 0.7–4.0)
MCHC: 33.2 g/dL (ref 30.0–36.0)
MCV: 88.9 fl (ref 78.0–100.0)
Monocytes Absolute: 0.5 10*3/uL (ref 0.1–1.0)
Monocytes Relative: 7.8 % (ref 3.0–12.0)
Neutro Abs: 3.7 10*3/uL (ref 1.4–7.7)
Neutrophils Relative %: 58.9 % (ref 43.0–77.0)
Platelets: 389 10*3/uL (ref 150.0–400.0)
RBC: 5.37 Mil/uL (ref 4.22–5.81)
RDW: 14.9 % (ref 11.5–15.5)
WBC: 6.3 10*3/uL (ref 4.0–10.5)

## 2021-10-11 LAB — COMPREHENSIVE METABOLIC PANEL
ALT: 9 U/L (ref 0–53)
AST: 15 U/L (ref 0–37)
Albumin: 4.8 g/dL (ref 3.5–5.2)
Alkaline Phosphatase: 56 U/L (ref 39–117)
BUN: 7 mg/dL (ref 6–23)
CO2: 28 mEq/L (ref 19–32)
Calcium: 9.9 mg/dL (ref 8.4–10.5)
Chloride: 103 mEq/L (ref 96–112)
Creatinine, Ser: 0.8 mg/dL (ref 0.40–1.50)
GFR: 115.52 mL/min (ref >60.00)
Glucose, Bld: 92 mg/dL (ref 70–99)
Potassium: 4.4 mEq/L (ref 3.5–5.1)
Sodium: 139 mEq/L (ref 135–145)
Total Bilirubin: 0.5 mg/dL (ref 0.2–1.2)
Total Protein: 8.1 g/dL (ref 6.0–8.3)

## 2021-10-11 LAB — LIPID PANEL
Cholesterol: 122 mg/dL (ref 0–200)
HDL: 45.1 mg/dL (ref >39.00)
LDL Cholesterol: 61 mg/dL (ref 0–99)
NonHDL: 76.42
Total CHOL/HDL Ratio: 3
Triglycerides: 76 mg/dL (ref 0.0–149.0)
VLDL: 15.2 mg/dL (ref 0.0–40.0)

## 2021-10-11 LAB — POCT URINALYSIS DIPSTICK
Bilirubin, UA: NEGATIVE
Blood, UA: NEGATIVE
Glucose, UA: NEGATIVE
Ketones, UA: NEGATIVE
Leukocytes, UA: NEGATIVE
Nitrite, UA: NEGATIVE
Protein, UA: NEGATIVE
Spec Grav, UA: 1.015 (ref 1.010–1.025)
Urobilinogen, UA: 0.2 E.U./dL
pH, UA: 6.5 (ref 5.0–8.0)

## 2021-10-11 LAB — TSH: TSH: 2.52 u[IU]/mL (ref 0.35–5.50)

## 2021-10-11 LAB — T4, FREE: Free T4: 0.84 ng/dL (ref 0.60–1.60)

## 2021-10-11 NOTE — Progress Notes (Signed)
Subjective:    Patient ID: Anthony Ortiz, male    DOB: [birth year], 36 y.o.   MRN: [id number]  HPI Pt in for first time.   Decided to do wellness exam and comprehinesive labs since going head and ordering thyroid labas  Pt here with sister. Pt has schitzophrenia. Sister states sometimes he does not liek to talk.   Pt is on respirdal in past. Over last 2 months he has not been talking much and has not been eating much. Lost 30 pounds. He states he eats a lot. Sister states not the case. Pt lives with his grandmother agrees with sister regarding amount he eats.   Pt sees psychiatrist at [facility]. During covid they did virtual visits. For brief time after respiradol injections he did better.  New medication that will be started not available in injection form. Oral med compliance is issue.   He did not have primary care in past.   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 Last episode was august. Only stayed 24 hours.  Sister states he was on thyroid medication in past. Last 2017.  He thinks he was ok with taking med in past by mouth.      Sister states she wants him tested for std. His ex wife had chlamydia. He gets back with x wife intermittently. Pt not having any obvous signs or symptoms.  Left side inguinal hernia since 2017. Sister describes it as large. Does not hurt on day to day basis. Last time had any pain was one week ago.  Pt does not want to do anything about this. Sister states he is scared about thought of surgery.    Weight loss- he states eats 3 meals a day.  Does drink 12 coca cola a day.    Review of Systems  Constitutional:  Negative for chills, fatigue and fever.  HENT:  Negative for congestion and drooling.   Respiratory:  Negative for cough, chest tightness, shortness of breath and wheezing.   Cardiovascular:  Negative for chest pain and palpitations.  Gastrointestinal:  Negative for abdominal pain.  Genitourinary:   Negative for dysuria, enuresis and flank pain.       Hx of hernia.  Musculoskeletal:  Negative for back pain, myalgias and neck pain.  Skin:  Negative for color change and rash.  Neurological:  Negative for dizziness, syncope, weakness and light-headedness.  Hematological:  Negative for adenopathy. Does not bruise/bleed easily.  Psychiatric/Behavioral:  Negative for behavioral problems.        See hpi.    Past Medical History:  Diagnosis Date   Kidney stones      Social History   Socioeconomic History   Marital status: Legally Separated    Spouse name: Not on file   Number of children: Not on file   Years of education: Not on file   Highest education level: Not on file  Occupational History   Not on file  Tobacco Use   Smoking status: Every Day    Packs/day: 1.00    Types: Cigarettes   Smokeless tobacco: Never  Substance and Sexual Activity   Alcohol use: No   Drug use: No   Sexual activity: Not on file  Other Topics Concern   Not on file  Social History Narrative   Not on file   Social Determinants of Health   Financial Resource Strain: Not on file  Food Insecurity: Not on file  Transportation Needs: Not on file  Physical Activity:  Not on file  Stress: Not on file  Social Connections: Not on file  Intimate Partner Violence: Not on file    Past Surgical History:  Procedure Laterality Date   URETER SURGERY      No family history on file.  No Known Allergies  Current Outpatient Medications on File Prior to Visit  Medication Sig Dispense Refill   hydrOXYzine (VISTARIL) 25 MG capsule Take 1 - 2 capsules by mouth up to 3 times a day as needed for anxiety 90 capsule 5   paliperidone (INVEGA) 3 MG 24 hr tablet Take 1 tablet (3 mg total) by mouth daily. 30 tablet 0   risperiDONE (RISPERDAL) 1 MG tablet Take 1 tablet by mouth at bedtime for breakthrough psychosis 30 tablet 0   risperiDONE (RISPERDAL) 1 MG/ML oral solution TAKE 1 ML BY MOUTH ONCE A DAY; ONLY 3  DAYS PRIOR TO AND ON DAY OF INJECTION 30 mL 0   risperiDONE microspheres (RISPERDAL CONSTA) 37.5 MG injection INJECT INTRAMUSCULARLY ONCE EVERY 2 WEEKS 1 each 6   risperiDONE microspheres (RISPERDAL CONSTA) 50 MG injection Give 50 mg (2 ml) IM into gluteal muscle once every 1.5 weeks. Rotate injection sites. 3 each 5   risperiDONE microspheres (RISPERDAL CONSTA) 50 MG injection INJECT 50MG  (2 ML) INTO THE GLUTEAL MUSCLE ONCE EVERY 2 WEEKS.  ROTATE INJECTION SITES. 2 mL 0   risperiDONE microspheres (RISPERDAL CONSTA) 50 MG injection Give 50 mg (2 ml) IM into gluteal muscle once every 2 weeks. Rotate injection sites. 2 each 5   traZODone (DESYREL) 50 MG tablet Take 1 tablet (50 mg total) by mouth at bedtime as needed and may repeat dose one time if needed for sleep. 30 tablet 0   valACYclovir (VALTREX) 1000 MG tablet Take 1 tablet (1,000 mg total) by mouth every 12 (twelve) hours. 30 tablet 0   No current facility-administered medications on file prior to visit.    BP 100/70   Pulse 66   Temp 98 F (36.7 C)   Resp 18   Ht 6' (1.829 m)   Wt 134 lb (60.8 kg)   SpO2 98%   BMI 18.17 kg/m        Objective:   Physical Exam General Mental Status- Alert. General Appearance- Not in acute distress.  Speaks minimally during exam fidgety with some pacing in room.  Skin General: Color- Normal Color. Moisture- Normal Moisture.  Neck Carotid Arteries- Normal color. Moisture- Normal Moisture. No carotid bruits. No JVD.  Chest and Lung Exam Auscultation: Breath Sounds:-Normal.  Cardiovascular Auscultation:Rythm- Regular. Murmurs & Other Heart Sounds:Auscultation of the heart reveals- No Murmurs.  Abdomen Inspection:-Inspeection Normal. Palpation/Percussion:Note:No mass. Palpation and Percussion of the abdomen reveal- Non Tender, Non Distended + BS, no rebound or guarding.   Neurologic Cranial Nerve exam:- CN III-XII intact(No nystagmus), symmetric smile. Strength:- 5/5 equal and  symmetric strength both upper and lower extremities.   Groin- obvious large left side inguinal hernia.     Assessment & Plan:   Patient Instructions  For you wellness exam today I have ordered cbc, cmp, tsh, t4, rpr, hiv and  lipid panel.  Std exposure. Urine ancillary studies, hiv and rpr.  Smoker. Advise to decrase or stop all together.  Prior polysubstance abuse. On suboxone. Hep c antibody added to labs.  Hypothyroid hx- follow labs.   For schizophrenia continue to follow up with pyschiatrist.  For inguinal hernia- counseled on potential complications. Offered referral to surgeon. Pt declines.  Can get vaccines later.  Discussed today/sister agrees.  Recommend exercise and healthy diet.  We will let you know lab results as they come in.  Follow up date appointment will be determined after lab review.     Esperanza Richters, PA-C

## 2021-10-11 NOTE — Addendum Note (Signed)
Addended by: Jeronimo Greaves on: 10/11/2021 10:18 AM   Modules accepted: Orders

## 2021-10-11 NOTE — Patient Instructions (Addendum)
For you wellness exam today I have ordered cbc, cmp, tsh, t4, rpr, hiv and  lipid panel.  Std exposure. Urine ancillary studies, hiv and rpr.  Smoker. Advise to decrease or stop all together.  Prior polysubstance abuse. On suboxone. Hep c antibody added to labs.  Hypothyroid hx- follow labs.   For schizophrenia continue to follow up with pyschiatrist.  For inguinal hernia- counseled on potential complications. Offered referral to surgeon. Pt declines. Sister wants me to place referral. But pt again declines.  Can get vaccines later. Discussed today/sister agrees.  Recommend exercise and healthy diet.  We will let you know lab results as they come in.  Follow up date appointment will be determined after lab review.    Preventive Care 28-39 Years Old, Male Preventive care refers to lifestyle choices and visits with your health care provider that can promote health and wellness. Preventive care visits are also called wellness exams. What can I expect for my preventive care visit? Counseling During your preventive care visit, your health care provider may ask about your: Medical history, including: Past medical problems. Family medical history. Current health, including: Emotional well-being. Home life and relationship well-being. Sexual activity. Lifestyle, including: Alcohol, nicotine or tobacco, and drug use. Access to firearms. Diet, exercise, and sleep habits. Safety issues such as seatbelt and bike helmet use. Sunscreen use. Work and work Statistician. Physical exam Your health care provider may check your: Height and weight. These may be used to calculate your BMI (body mass index). BMI is a measurement that tells if you are at a healthy weight. Waist circumference. This measures the distance around your waistline. This measurement also tells if you are at a healthy weight and may help predict your risk of certain diseases, such as type 2 diabetes and high blood  pressure. Heart rate and blood pressure. Body temperature. Skin for abnormal spots. What immunizations do I need?  Vaccines are usually given at various ages, according to a schedule. Your health care provider will recommend vaccines for you based on your age, medical history, and lifestyle or other factors, such as travel or where you work. What tests do I need? Screening Your health care provider may recommend screening tests for certain conditions. This may include: Lipid and cholesterol levels. Diabetes screening. This is done by checking your blood sugar (glucose) after you have not eaten for a while (fasting). Hepatitis B test. Hepatitis C test. HIV (human immunodeficiency virus) test. STI (sexually transmitted infection) testing, if you are at risk. Talk with your health care provider about your test results, treatment options, and if necessary, the need for more tests. Follow these instructions at home: Eating and drinking  Eat a healthy diet that includes fresh fruits and vegetables, whole grains, lean protein, and low-fat dairy products. Drink enough fluid to keep your urine pale yellow. Take vitamin and mineral supplements as recommended by your health care provider. Do not drink alcohol if your health care provider tells you not to drink. If you drink alcohol: Limit how much you have to 0-2 drinks a day. Know how much alcohol is in your drink. In the [place] one drink equals one 12 oz bottle of beer (355 mL), one 5 oz glass of wine (148 mL), or one 1 oz glass of hard liquor (44 mL). Lifestyle Brush your teeth every morning and night with fluoride toothpaste. Floss one time each day. Exercise for at least 30 minutes 5 or more days each week. Do not use any products  that contain nicotine or tobacco. These products include cigarettes, chewing tobacco, and vaping devices, such as e-cigarettes. If you need help quitting, ask your health care provider. Do not use drugs. If you  are sexually active, practice safe sex. Use a condom or other form of protection to prevent STIs. Find healthy ways to manage stress, such as: Meditation, yoga, or listening to music. Journaling. Talking to a trusted person. Spending time with friends and family. Minimize exposure to UV radiation to reduce your risk of skin cancer. Safety Always wear your seat belt while driving or riding in a vehicle. Do not drive: If you have been drinking alcohol. Do not ride with someone who has been drinking. If you have been using any mind-altering substances or drugs. While texting. When you are tired or distracted. Wear a helmet and other protective equipment during sports activities. If you have firearms in your house, make sure you follow all gun safety procedures. Seek help if you have been physically or sexually abused. What's next? Go to your health care provider once a year for an annual wellness visit. Ask your health care provider how often you should have your eyes and teeth checked. Stay up to date on all vaccines. This information is not intended to replace advice given to you by your health care provider. Make sure you discuss any questions you have with your health care provider. Document Revised: 06/20/2020 Document Reviewed: 06/20/2020 Elsevier Patient Education  2023 [employer].

## 2021-10-14 LAB — URINE CYTOLOGY ANCILLARY ONLY
Chlamydia: NEGATIVE
Comment: NEGATIVE
Comment: NEGATIVE
Comment: NORMAL
Neisseria Gonorrhea: NEGATIVE
Trichomonas: NEGATIVE

## 2021-10-14 NOTE — Addendum Note (Signed)
Addended by: Anabel Halon on: 10/14/2021 06:08 PM   Modules accepted: Orders

## 2021-10-17 LAB — RPR: RPR Ser Ql: NONREACTIVE

## 2021-10-17 LAB — HIV ANTIBODY (ROUTINE TESTING W REFLEX): HIV 1&2 Ab, 4th Generation: NONREACTIVE

## 2021-10-17 LAB — HCV RNA,QUANTITATIVE REAL TIME PCR
HCV Quantitative Log: <1.18 Log IU/mL
HCV RNA, PCR, QN: <15 IU/mL

## 2021-10-17 LAB — HEPATITIS C ANTIBODY: Hepatitis C Ab: REACTIVE — AB

## 2021-11-06 ENCOUNTER — Other Ambulatory Visit (HOSPITAL_COMMUNITY): Payer: Self-pay

## 2021-11-07 ENCOUNTER — Other Ambulatory Visit (HOSPITAL_COMMUNITY): Payer: Self-pay

## 2021-11-12 ENCOUNTER — Encounter (HOSPITAL_COMMUNITY): Payer: Self-pay

## 2021-11-12 ENCOUNTER — Other Ambulatory Visit (HOSPITAL_COMMUNITY): Payer: Self-pay

## 2021-11-13 ENCOUNTER — Other Ambulatory Visit (HOSPITAL_COMMUNITY): Payer: Self-pay

## 2021-12-13 ENCOUNTER — Other Ambulatory Visit (HOSPITAL_COMMUNITY): Payer: Self-pay

## 2021-12-13 MED ORDER — PERSERIS 90 MG ~~LOC~~ PRSY
90.0000 mg | PREFILLED_SYRINGE | SUBCUTANEOUS | 0 refills | Status: DC
  Filled 2021-12-13: qty 1, 30d supply, fill #0

## 2021-12-16 ENCOUNTER — Other Ambulatory Visit (HOSPITAL_COMMUNITY): Payer: Self-pay

## 2021-12-25 ENCOUNTER — Other Ambulatory Visit (HOSPITAL_COMMUNITY): Payer: Self-pay

## 2021-12-25 MED ORDER — RISPERIDONE 1 MG PO TABS
1.0000 mg | ORAL_TABLET | Freq: Every evening | ORAL | 0 refills | Status: AC
  Filled 2021-12-25: qty 15, 15d supply, fill #0

## 2022-02-13 ENCOUNTER — Other Ambulatory Visit (HOSPITAL_COMMUNITY): Payer: Self-pay

## 2022-02-15 ENCOUNTER — Other Ambulatory Visit (HOSPITAL_COMMUNITY): Payer: Self-pay

## 2022-02-15 MED ORDER — PERSERIS 90 MG ~~LOC~~ PRSY
90.0000 mg | PREFILLED_SYRINGE | SUBCUTANEOUS | 0 refills | Status: DC
  Filled 2022-02-15 – 2022-02-21 (×2): qty 1, 30d supply, fill #0

## 2022-02-19 ENCOUNTER — Other Ambulatory Visit (HOSPITAL_COMMUNITY): Payer: Self-pay

## 2022-02-21 ENCOUNTER — Other Ambulatory Visit (HOSPITAL_BASED_OUTPATIENT_CLINIC_OR_DEPARTMENT_OTHER): Payer: Self-pay

## 2022-02-21 ENCOUNTER — Other Ambulatory Visit: Payer: Self-pay

## 2022-02-21 ENCOUNTER — Other Ambulatory Visit (HOSPITAL_COMMUNITY): Payer: Self-pay

## 2022-07-15 ENCOUNTER — Other Ambulatory Visit (HOSPITAL_COMMUNITY): Payer: Self-pay

## 2022-07-22 ENCOUNTER — Other Ambulatory Visit (HOSPITAL_COMMUNITY): Payer: Self-pay

## 2022-07-22 MED ORDER — PERSERIS 90 MG ~~LOC~~ PRSY
90.0000 mg | PREFILLED_SYRINGE | SUBCUTANEOUS | 0 refills | Status: AC
  Filled 2022-07-22 (×2): qty 1, 30d supply, fill #0

## 2022-07-23 ENCOUNTER — Other Ambulatory Visit: Payer: Self-pay
